# Patient Record
Sex: Female | Born: 1985
Health system: Southern US, Community
[De-identification: ages and names within clinical notes are randomized; demographics above are authoritative.]

## PROBLEM LIST (undated history)

## (undated) DIAGNOSIS — L719 Rosacea, unspecified: Secondary | ICD-10-CM

## (undated) DIAGNOSIS — E079 Disorder of thyroid, unspecified: Secondary | ICD-10-CM

## (undated) DIAGNOSIS — S62609A Fracture of unspecified phalanx of unspecified finger, initial encounter for closed fracture: Secondary | ICD-10-CM

## (undated) DIAGNOSIS — G43009 Migraine without aura, not intractable, without status migrainosus: Secondary | ICD-10-CM

## (undated) DIAGNOSIS — D3610 Benign neoplasm of peripheral nerves and autonomic nervous system, unspecified: Secondary | ICD-10-CM

## (undated) DIAGNOSIS — M810 Age-related osteoporosis without current pathological fracture: Secondary | ICD-10-CM

## (undated) DIAGNOSIS — K589 Irritable bowel syndrome without diarrhea: Secondary | ICD-10-CM

## (undated) HISTORY — DX: Disorder of thyroid, unspecified: E07.9

## (undated) HISTORY — DX: Migraine without aura, not intractable, without status migrainosus: G43.009

## (undated) HISTORY — PX: WISDOM TOOTH EXTRACTION: SHX21

## (undated) HISTORY — PX: FINGER FRACTURE SURGERY: SHX638

## (undated) HISTORY — DX: Age-related osteoporosis without current pathological fracture: M81.0

## (undated) HISTORY — DX: Rosacea, unspecified: L71.9

## (undated) HISTORY — DX: Fracture of unspecified phalanx of unspecified finger, initial encounter for closed fracture: S62.609A

## (undated) HISTORY — PX: COLONOSCOPY: SHX174

## (undated) HISTORY — DX: Benign neoplasm of peripheral nerves and autonomic nervous system, unspecified: D36.10

---
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
## 2007-01-31 ENCOUNTER — Ambulatory Visit: Payer: Self-pay | Admitting: Internal Medicine

## 2007-03-04 ENCOUNTER — Ambulatory Visit: Payer: Self-pay | Admitting: Internal Medicine

## 2008-02-06 ENCOUNTER — Other Ambulatory Visit: Admission: RE | Admit: 2008-02-06 | Discharge: 2008-02-06 | Payer: Self-pay | Admitting: Obstetrics and Gynecology

## 2008-03-20 ENCOUNTER — Ambulatory Visit: Payer: Self-pay | Admitting: Sports Medicine

## 2008-03-20 DIAGNOSIS — M84369A Stress fracture, unspecified tibia and fibula, initial encounter for fracture: Secondary | ICD-10-CM | POA: Insufficient documentation

## 2008-03-20 DIAGNOSIS — M2170 Unequal limb length (acquired), unspecified site: Secondary | ICD-10-CM | POA: Insufficient documentation

## 2008-03-20 DIAGNOSIS — M4120 Other idiopathic scoliosis, site unspecified: Secondary | ICD-10-CM | POA: Insufficient documentation

## 2008-03-20 DIAGNOSIS — M84376A Stress fracture, unspecified foot, initial encounter for fracture: Secondary | ICD-10-CM | POA: Insufficient documentation

## 2008-07-27 ENCOUNTER — Ambulatory Visit: Payer: Self-pay | Admitting: Sports Medicine

## 2008-07-27 DIAGNOSIS — M21619 Bunion of unspecified foot: Secondary | ICD-10-CM | POA: Insufficient documentation

## 2008-10-24 ENCOUNTER — Ambulatory Visit: Payer: Self-pay | Admitting: Sports Medicine

## 2008-10-24 DIAGNOSIS — M629 Disorder of muscle, unspecified: Secondary | ICD-10-CM | POA: Insufficient documentation

## 2008-12-23 ENCOUNTER — Emergency Department (HOSPITAL_COMMUNITY): Admission: EM | Admit: 2008-12-23 | Discharge: 2008-12-23 | Payer: Self-pay | Admitting: Family Medicine

## 2008-12-26 ENCOUNTER — Ambulatory Visit: Payer: Self-pay | Admitting: Sports Medicine

## 2009-01-09 ENCOUNTER — Encounter: Admission: RE | Admit: 2009-01-09 | Discharge: 2009-02-22 | Payer: Self-pay | Admitting: Sports Medicine

## 2009-01-15 ENCOUNTER — Encounter: Payer: Self-pay | Admitting: Sports Medicine

## 2009-01-23 ENCOUNTER — Ambulatory Visit: Payer: Self-pay | Admitting: Sports Medicine

## 2009-02-03 ENCOUNTER — Emergency Department (HOSPITAL_COMMUNITY): Admission: EM | Admit: 2009-02-03 | Discharge: 2009-02-03 | Payer: Self-pay | Admitting: Family Medicine

## 2009-03-11 ENCOUNTER — Encounter: Payer: Self-pay | Admitting: Sports Medicine

## 2009-09-30 ENCOUNTER — Ambulatory Visit: Payer: Self-pay | Admitting: Sports Medicine

## 2009-09-30 DIAGNOSIS — M79609 Pain in unspecified limb: Secondary | ICD-10-CM | POA: Insufficient documentation

## 2009-12-30 ENCOUNTER — Ambulatory Visit: Payer: Self-pay | Admitting: Family Medicine

## 2009-12-30 DIAGNOSIS — IMO0002 Reserved for concepts with insufficient information to code with codable children: Secondary | ICD-10-CM | POA: Insufficient documentation

## 2010-02-07 ENCOUNTER — Ambulatory Visit (HOSPITAL_COMMUNITY): Admission: RE | Admit: 2010-02-07 | Discharge: 2010-02-07 | Payer: Self-pay | Admitting: Gastroenterology

## 2010-02-24 ENCOUNTER — Ambulatory Visit: Payer: Self-pay | Admitting: Family Medicine

## 2010-03-12 ENCOUNTER — Ambulatory Visit (HOSPITAL_COMMUNITY): Admission: RE | Admit: 2010-03-12 | Discharge: 2010-03-12 | Payer: Self-pay | Admitting: Sports Medicine

## 2010-03-12 ENCOUNTER — Ambulatory Visit: Payer: Self-pay | Admitting: Sports Medicine

## 2010-03-13 ENCOUNTER — Encounter (INDEPENDENT_AMBULATORY_CARE_PROVIDER_SITE_OTHER): Payer: Self-pay | Admitting: *Deleted

## 2010-03-17 ENCOUNTER — Ambulatory Visit (HOSPITAL_COMMUNITY): Admission: RE | Admit: 2010-03-17 | Discharge: 2010-03-17 | Payer: Self-pay | Admitting: Sports Medicine

## 2010-03-20 ENCOUNTER — Ambulatory Visit: Payer: Self-pay | Admitting: Sports Medicine

## 2010-08-31 ENCOUNTER — Encounter: Payer: Self-pay | Admitting: Gastroenterology

## 2010-09-09 NOTE — Miscellaneous (Signed)
Summary: MRI APPT  MRI OF R TIBIA [place]  MON AUG 8TH AT 1PM ARRIVE 15 MINS EARLY [phone]

## 2010-09-09 NOTE — Assessment & Plan Note (Signed)
Summary: SHIN SPLINT INJURY,MC   Vital Signs:  Patient profile:   25 year old female Height:      64 inches Weight:      109.25 pounds BMI:     18.82 Pulse rate:   62 / minute BP sitting:   132 / 78  (right arm)  Vitals Entered By: Terese Door (September 30, 2009 8:55 AM) CC: right shin splint injury   Primary Provider:  Enid Baas MD  CC:  right shin splint injury.  History of Present Illness: patient is 25 y/o female here c/o R shin pain. started in January. located in medial aspect of anterior R calf. seemed to start when transitioning to training for track and running on harder surfaces. tried rest for 1 1/2 weeks which helped pain, but it returned once patient went back to running. has tried ice, ibuprofen, without relief. trained on underwater treadmill over the weekend without any difficulty. runs about 40-45 miles per week.   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  Physical Exam  General:  Well-developed,well-nourished,in no acute distress; alert,appropriate and cooperative throughout examination Msk:  +mild TTP of anterior medial R calf in area of soleus. pain exacerbated by R heel raise with toes turned inward.   percussion of tibia is not tender no swelling Extremities:  running gait shows forefoot varus strike no limping now foot placement is straight!    Impression & Recommendations:  Problem # 1:  CALF PAIN, RIGHT (ICD-729.5) Assessment New likely 2/2 strain of soleus muscle. patient to use voltaren gel 3-4 times daily, ice massage daily, and do shin exercises (heel raises, walking lunges with weights, etc). patient also encouraged to run on softer terrain when possible.   can try to train if pain stays < 3 of 10 xtrain but stay off hard indoor surface that  may have triggered this  Problem # 2:  Hx of STRESS FRACTURE, TIBIA (ICD-733.93) not enough evidence to think this is new stress fx if pain increases or swelling recurs though we will reassess and do [place] scan  Complete Medication List: 1)  Voltaren 1 % Gel (Diclofenac sodium) .... Apply 4 g to affected area 3-4 x daily. disp 100 gm tube. Prescriptions: VOLTAREN 1 % GEL (DICLOFENAC SODIUM) apply 4 g to affected area 3-4 x daily. disp 100 gm tube.  #1 x 1   Entered by:   Lequita Asal  MD   Authorized by:   Enid Baas MD   Signed by:   Lequita Asal  MD on 09/30/2009   Method used:   Electronically to        [facility]* (retail)       [address]       [address] [place] [employer]       [place], [place]  [address]       Ph: [phone]       Fax: [phone]   RxID:   [id number]

## 2010-09-09 NOTE — Assessment & Plan Note (Signed)
Summary: F/U SHIN SPLINTS,MC   Vital Signs:  Patient profile:   25 year old female BP sitting:   97 / 64  Vitals Entered By: Lillia Pauls CMA (March 12, 2010 9:57 AM)  Primary Provider:  Enid Baas MD   History of Present Illness: R tibial pain since January. Started along medial border but now anterior border Continuous cycle of trying to build up mileage and breaking down since then. Saw dr Jennette Kettle 2 weeks ago, given dorsiflexion exercises and straight line drills. Pain persisting.  Running 30 mins 6 days week, cut back from 40-45, no impovement. Worse this week, mainly after running. Biked friday saturday, bad pain, ran monday and tuesday pain seems a bit less. L tibial stress # 2 yrs ago.   Allergies: No Known Drug Allergies  Physical Exam  General:  Well-developed,well-nourished,in no acute distress; alert,appropriate and cooperative throughout examination Msk:  RT Anterior tibia TTP  ~6 inches proximal to tibio-talar joint.  Some tenderness along medial border. Anterior tibial pain on resisted dorsiflexion.  there is some localized swelling over the ant tibia as well  LT this is non tender  orthotics reviewed and base on RT is worn down Additional Exam:  MSK [place] Anterior tibial cortex shows localized swelling and hypoechoic area on transverse scan there is evidence of cortical thickening and some calcification no increase in doppler flow suspicious but not diagnostic for  stress fx  images saved   Impression & Recommendations:  Problem # 1:  SHIN SPLINTS (ICD-844.9)  Orders: Radiology other (Radiology Other)   we need to move ahead with Xray If this is negative consider MRI she does have an air splint and will need to use that if stress fx If an anterior stress fx healing will be slow  stop running xtrain on bike or pool  try to work this out before return to [place] [address] in 2 wks  Complete Medication List: 1)  Voltaren 1 % Gel (Diclofenac sodium) ....  Apply 4 g to affected area 3-4 x daily. disp 100 gm tube.  Appended Document: F/U SHIN SPLINTS,MC Note we did a second base on her orthotic on RT to help further correct leg length and give more cushion

## 2010-09-09 NOTE — Assessment & Plan Note (Signed)
Summary: F/U,MC   Vital Signs:  Patient profile:   25 year old female BP sitting:   103 / 67  Vitals Entered By: Lillia Pauls CMA (March 20, 2010 2:40 PM)  Primary Provider:  Enid Baas MD   History of Present Illness:  Catherine Clay returns today for follow of MRI This showed edema in distal to mid third of tibia medially and posteriorly RT > LT but bilat no sign of stress fx still ahs been painful has switched to biking Max mileage this summer was about 45  comes for replacement of orthotics as she is getting back into shin problems and others are 23 years old  Allergies: No Known Drug Allergies  Physical Exam  General:  Well-developed,well-nourished,in no acute distress; alert,appropriate and cooperative throughout examination Msk:  RT leg is ~ 1.5 cms short no swelling good alginment and strngth  gait shows strike in forefoot varus with leg length correction RT foot motion is more normal but swings on back kick to outside without this   Impression & Recommendations:  Problem # 1:  SHIN SPLINTS (ICD-844.9)  has had left tibial stress fx and RT tibial stress rxn in past  now with bone edema bilat on MRI  will put into new orthotics change training to 30 mpw max good Vit D, C and Cdlcium xtrain and work strength  Note she has had BMD and was low but not abnormal  Patient was fitted for a standard, cushioned, semi-rigid orthotic.  The orthotic was heated and the patient stood on the orthotic blank positioned on the orthotic stand. The patient was positioned in subtalar neutral position and 10 degrees of ankle dorsiflexion in a weight bearing stance. After completion of molding a stable based was applied to the orthotic blank.   The blank was ground to a stable position for weight bearing. size 7 ble swirl base blue EVA posting forefoot lat posting bila additional orthotic padding  extra foam layer to RT leg for leg length  35 mins  Orders: Orthotic Materials,  each unit (L3002) Garment,belt,sleeve or other covering ,elastic or similar stretch (E4540) Garment,belt,sleeve or other covering ,elastic or similar stretch (J8119)  Problem # 2:  CALF PAIN, RIGHT (ICD-729.5)  will add body helix calf sleeves as the post tib and calf mm keep getting tight  maybe this will lessen amt of mm soreness  will reck on return from  [facility]  Orders: Garment,belt,sleeve or other covering ,elastic or similar stretch (J4782) Garment,belt,sleeve or other covering ,elastic or similar stretch (N5621)  Complete Medication List: 1)  Voltaren 1 % Gel (Diclofenac sodium) .... Apply 4 g to affected area 3-4 x daily. disp 100 gm tube.  Appended Document: F/U,MC

## 2010-09-09 NOTE — Assessment & Plan Note (Signed)
Summary: F/U,MC   Vital Signs:  Patient profile:   25 year old female BP sitting:   112 / 74  Primary Care Provider:  Enid Baas MD   History of Present Illness: Continued right shin pain. Running 35-45 minutes a day, six days a week. pain is medial right shin orthotics she had made 2 y ago--still seem to help when running on hard surface but on grass or trails seems like they make things worse  no new injury  Current Medications (verified): 1)  Voltaren 1 % Gel (Diclofenac Sodium) .... Apply 4 G To Affected Area 3-4 X Daily. Disp 100 Gm Tube.  Allergies: No Known Drug Allergies  Physical Exam  General:  alert, well-developed, well-nourished, and well-hydrated.   Msk:  medial to shin on right TTP.   GAIT: normal varus forefot strike  distally NV intact   Impression & Recommendations:  Problem # 1:  SHIN SPLINTS (ICD-844.9)  Orders: Aircast Leg brace (E4540) Gabriel discussion she has questions  about using an aircast we will try it while she runs also consider making her new orthotics she ill let me know  Complete Medication List: 1)  Voltaren 1 % Gel (Diclofenac sodium) .... Apply 4 g to affected area 3-4 x daily. disp 100 gm tube.

## 2010-09-09 NOTE — Assessment & Plan Note (Signed)
Summary: SHIN SPLINTS,MC   Vital Signs:  Patient profile:   25 year old female BP sitting:   110 / 82  Vitals Entered By: Lillia Pauls CMA (Dec 30, 2009 2:22 PM)  Primary Care Provider:  Enid Baas MD   History of Present Illness: DATE of INJURY:  12/28/2009 Right foot pain between 1st and 2nd toes  that started after hiking (in tennis shoes, with orthotics) on Saturday. Pain is 2-4/10, feels sort of numb. Similar to  pain she had with a previous injury on area inlateral foot that was dx as a stress reaction. Worried that this si going  to be a [place] term problem.  2) Right lower leg pain, not exactly like a shin splint but similar. Feels like the muscle needs to stretch. Worse day after running. [place] during run. Pain is mild to moderate--she desires stretching exercises.  Currently running 10 min every other day and biking or swimming to xtrain. Runs cross country  PERTINENT PMH/PSH: Shin splints in past, esp troublesome with switching from xc to trackor hader surfaces.  Allergies: No Known Drug Allergies  Physical Exam  General:  alert, well-developed, well-nourished, and well-hydrated.   No pain with heel raise with foot inverted.  Area between 1 2 MT heads is area on foot where pain is centered. Not tender to palpation. Slight anasthesia to soft touch in asmall focal area about dime size but sharp sensation iintact  [place]. i imaged biothe her shin and her MT heads and saw no edema, no suspicious areas for stress fracture on shin. Msk:  Right shin is nontender to palpation.  the soleus muscle (as accessed from area next to shin) is somewhat tender to palpation. The calf is sioft.   FEET R>L foot has loss of transverse arch with a lot of pressure on 2nd MT head. No callous formation.   Impression & Recommendations:  Problem # 1:  SHIN SPLINTS (ICD-844.9) mild early shin splint. I [place] the area mostly to reassure her--this is not a stress fracture. Recommended HEP and gave her  handout. ICE.  Problem # 2:  FOOT PAIN, RIGHT (ICD-729.5) small area of anasthesia c/w mild nerve injury of 1st interdigital nerve. She has not had this problem when running so I hesitate to adjust her orthotics. She went hiking in poorly supportive shoes and she has loss of transverse arch. She does not k=hike often---if this becomes ongoing issue would either add MT pad to her current orthotic or more likely make 2nd pair for walking and add MT pad to those. For now, would not hike, can continue to run unless area worsens, rtc as needed. iexpect this small area of anasthesia to resolve over nect 2 weeks.  Complete Medication List: 1)  Voltaren 1 % Gel (Diclofenac sodium) .... Apply 4 g to affected area 3-4 x daily. disp 100 gm tube.

## 2010-10-31 IMAGING — CR DG TIBIA/FIBULA 2V*R*
4 series · 4 of 4 positions shown · non-contrast
Comparison: None

CLINICAL DATA: Leg pain.

RIGHT TIBIA AND FIBULA - 2 VIEW

[t tib/fib ap right (1 of 2)]
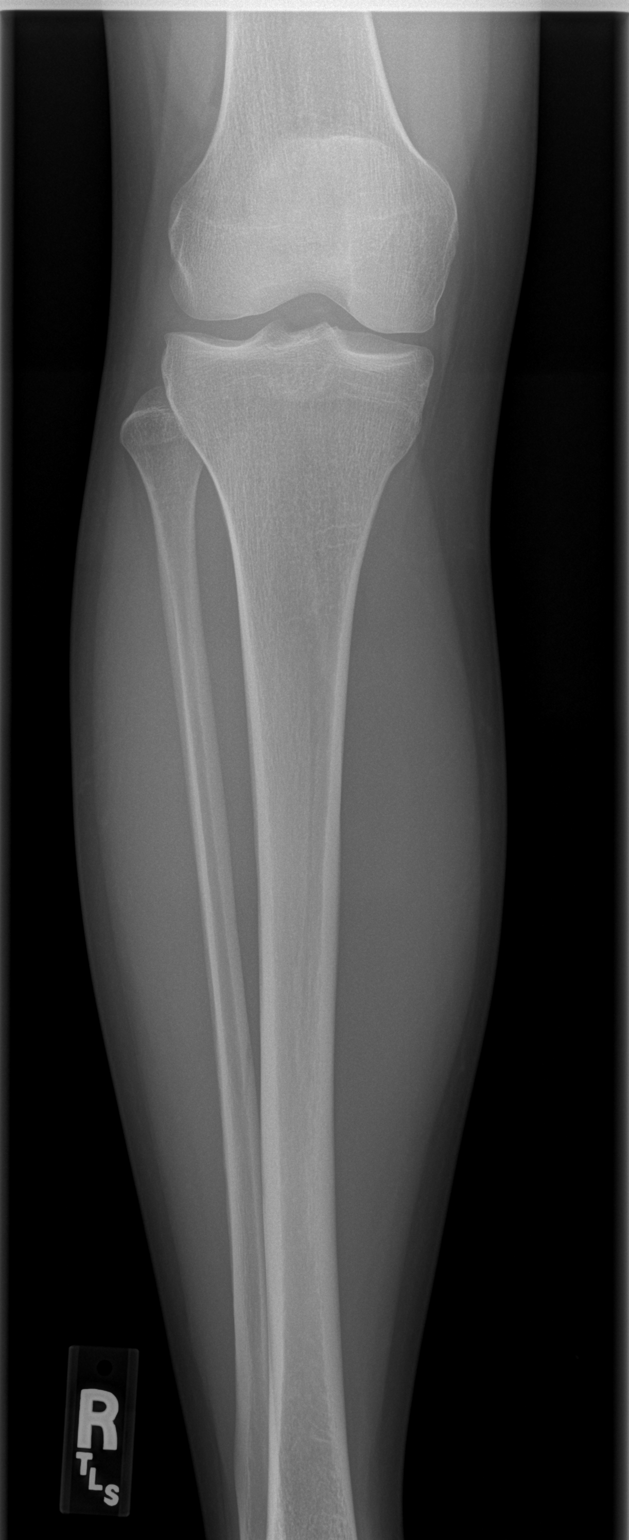

[t tib/fib ap right (2 of 2)]
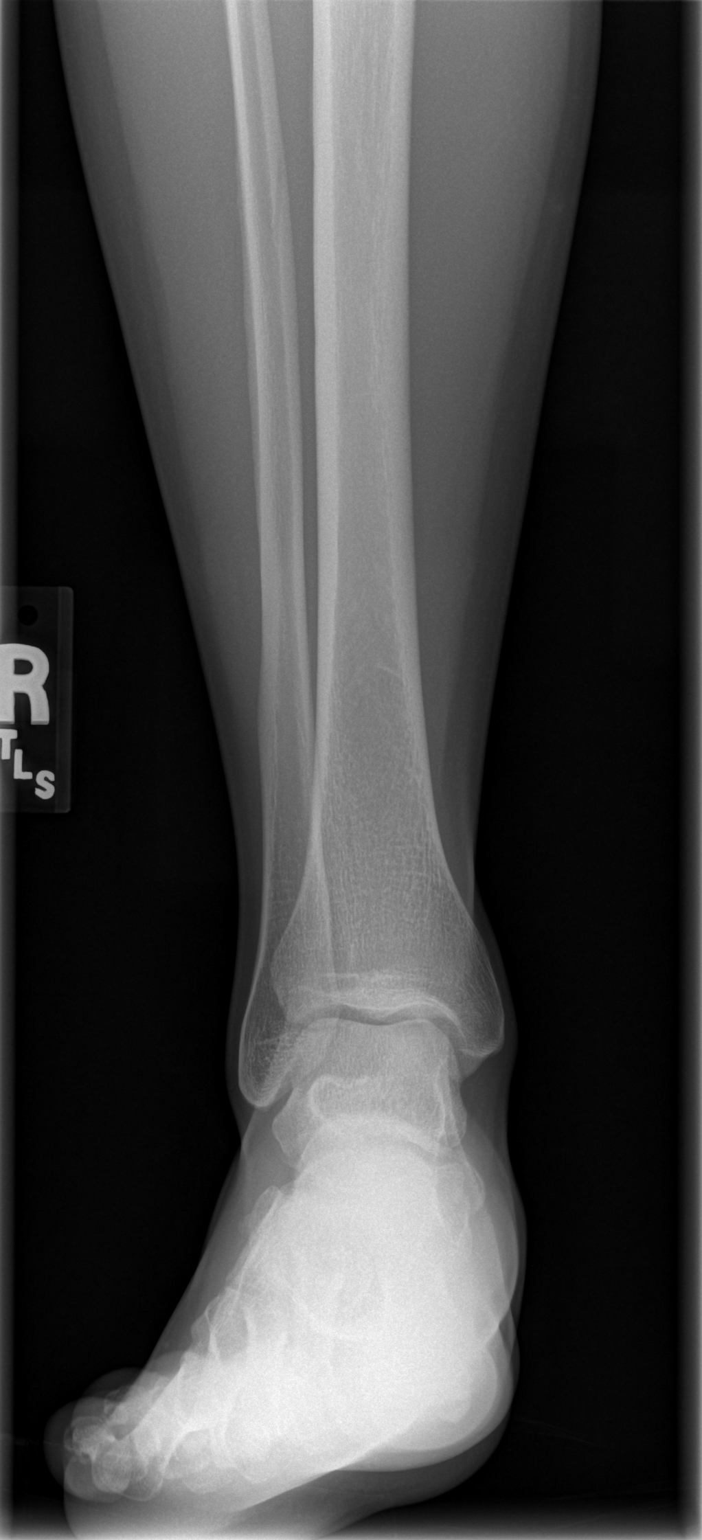

[t tib/fib lat right (1 of 2)]
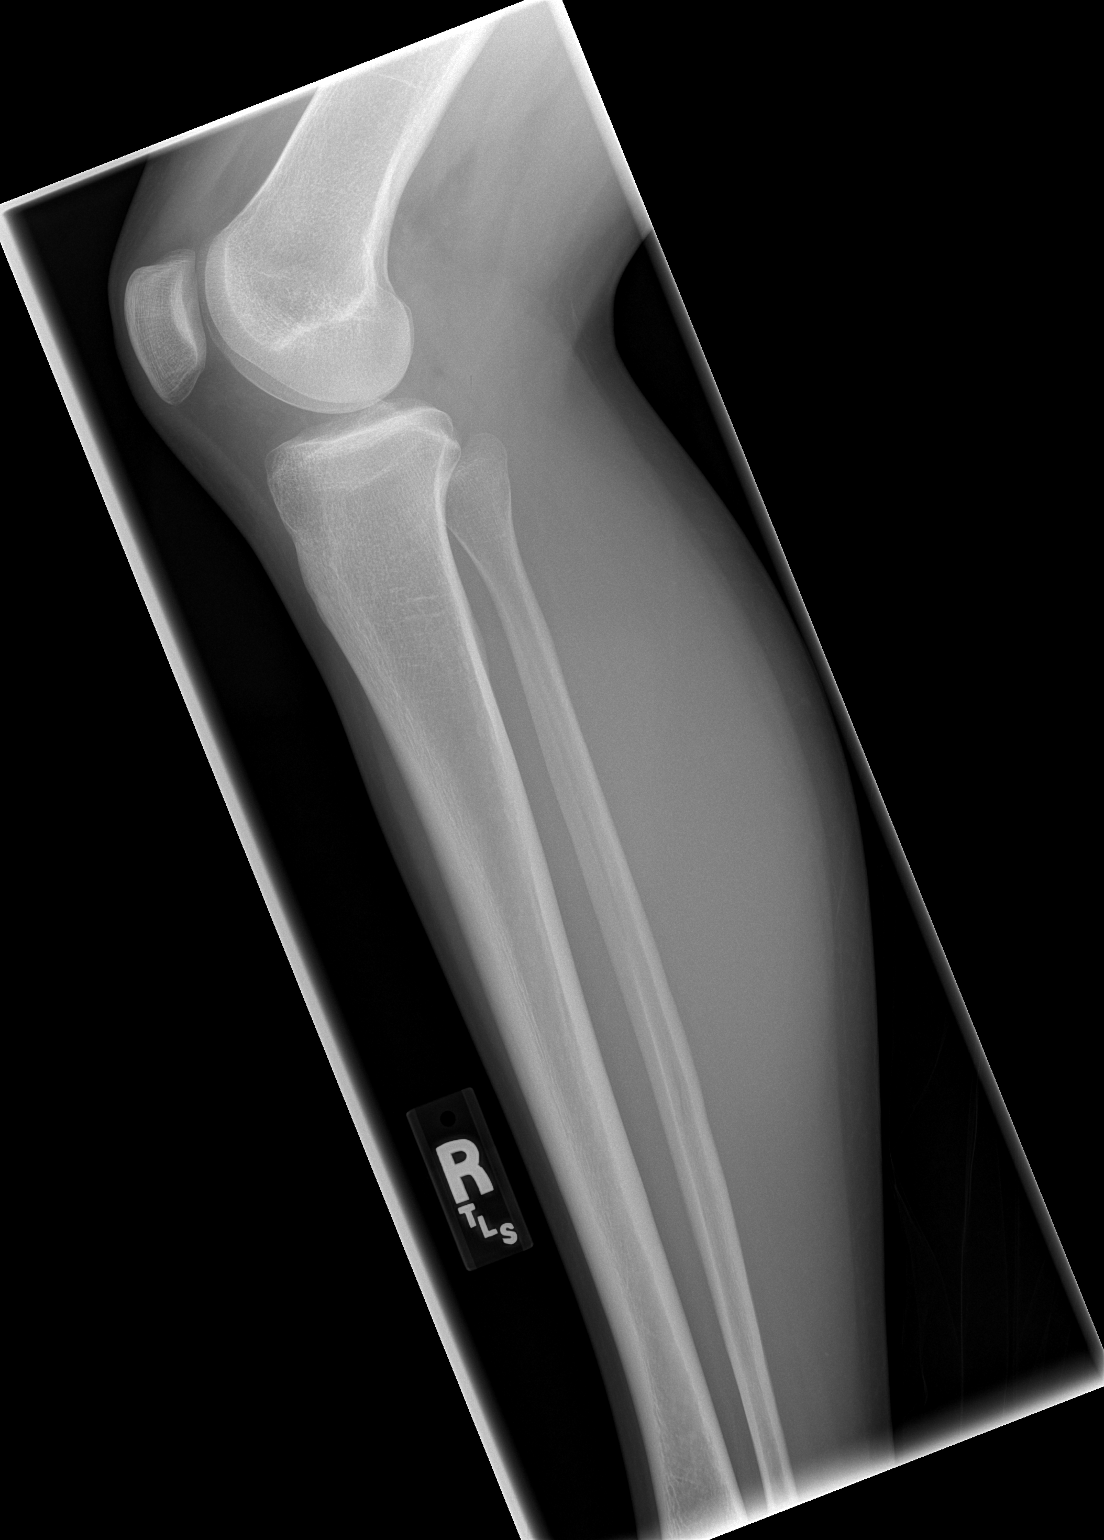

[t tib/fib lat right (2 of 2)]
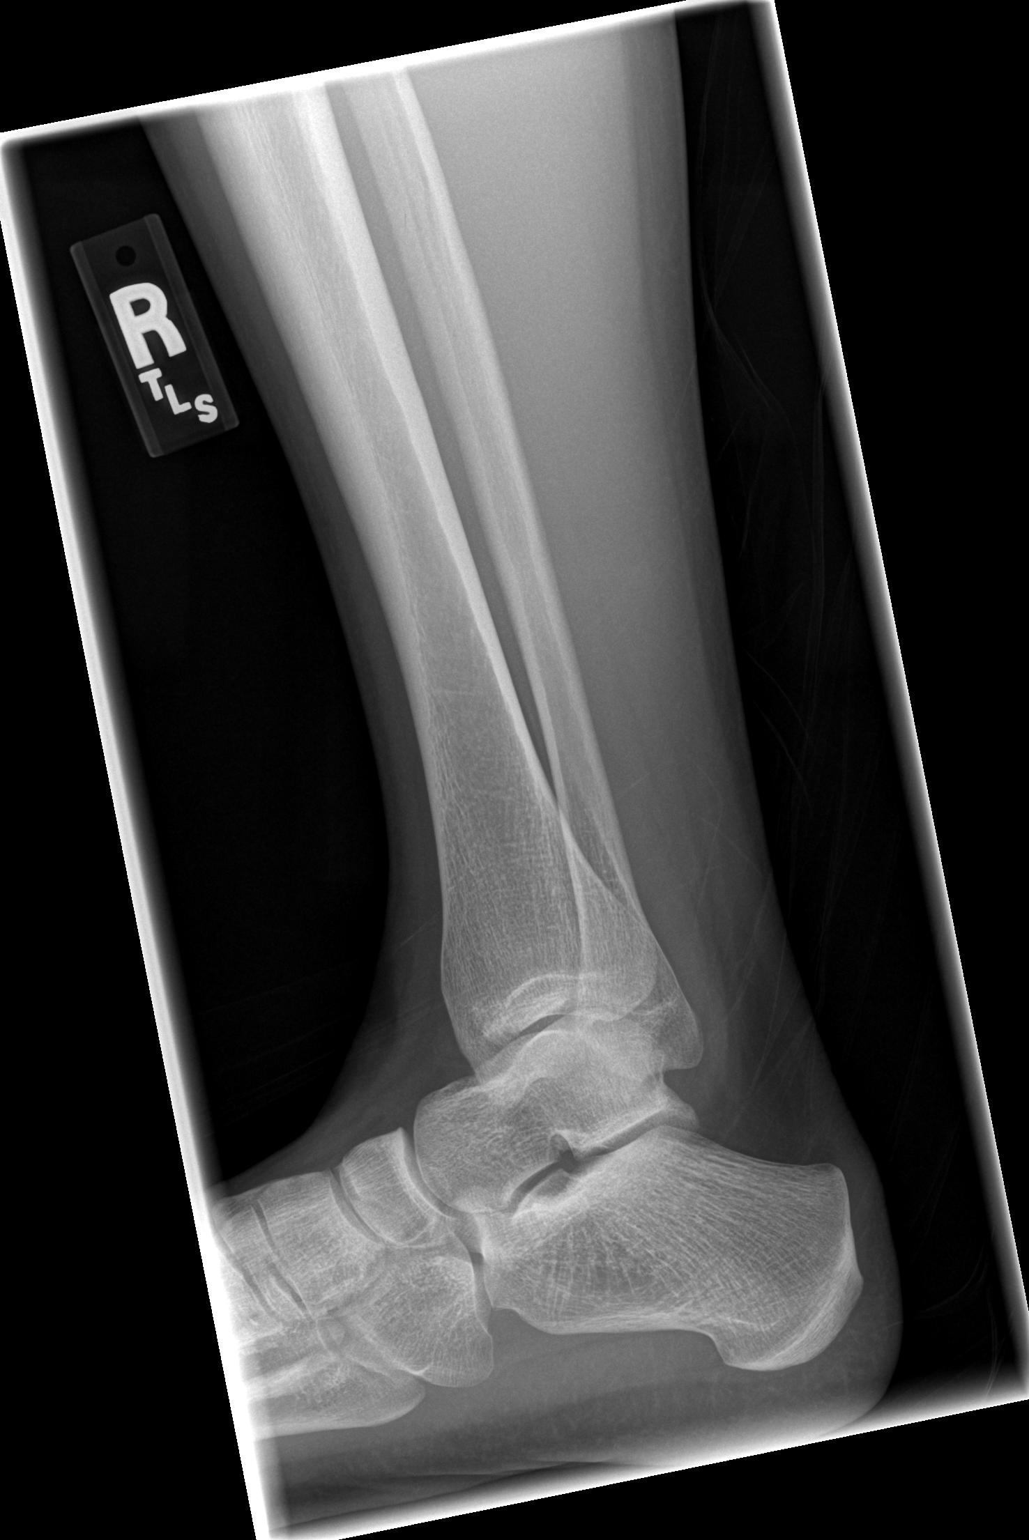

[4 of 4 positions shown; findings below may reference images not displayed]

FINDINGS: The medial ankle joints are maintained.  The tibia and
fibula are normal.
IMPRESSION: No acute bony findings.

## 2010-12-23 NOTE — Assessment & Plan Note (Signed)
[facility]                         GASTROENTEROLOGY OFFICE NOTE   NAME:SHULL, LURLENE                           MRN:          [id number]  DATE:03/04/2007                            DOB:          March 20, 1986    Lowell returns today for followup of anal fissure, which was diagnosed on  anoscopy on January 31, 2007.  This is a recurrent fissure.  She was put on  Anopram cream and Anusol-HC suppositories with complete resolution of  her symptoms until last week when she ran out of the suppositories, and  the symptoms of rectal pain recurred.  She denies any rectal bleeding,  but the pain is back but not as bad as before.   I have discussed anal fissure with Alaynah and her mother.  I have  suggested that she may need surgical repair of the anal fissure because  of several recurrences but also gave her the option of continuing Anusol-  HC suppositories and Anaprim cream indefinitely, as it keeps it under  control.  She would prefer at this time to treat it conservatively to  avoid surgery.  I asked Megumi to check with me in about three months.  The surgery ought to be done when school is out, either in the summer or  in the wintertime.  She will either call me for referral or come back to  be re-examined.     Hedwig Morton. Juanda Chance, MD  Electronically Signed    DMB/MedQ  DD: 03/04/2007  DT: 03/04/2007  Job #: [id number]   cc:   Duncan Dull, M.D.

## 2010-12-23 NOTE — Assessment & Plan Note (Signed)
[facility]                         GASTROENTEROLOGY OFFICE NOTE   NAME:Catherine Clay, Catherine Clay                           MRN:          [id number]  DATE:01/31/2007                            DOB:          1986/01/19    Catherine Clay is an 25 year old white female with history of anal fissure first  evaluated in August 2007.  She was put on Analpram cream and Anusol HC  suppositories.  The rectal pain never really went away and she has  chronic rectal discomfort when having bowel movements.  She currently  denies any rectal bleeding.  She denies constipation.  She has used some  over-the-counter preparations.   MEDICATIONS:  Tretinoin, [place]stone Vitamins, and hemorrhoidal  suppositories q.h.s.   PHYSICAL EXAMINATION:  VITAL SIGNS:  Blood pressure 98/60, pulse 56, and  weight 174 pounds.  ABDOMEN:  Unremarkable.  LUNGS:  Clear to auscultation.  CORONARY:  Normal S1, normal S2.  RECTAL:  Anoscopic exam reveals anal fissure at 3 o'clock.  It is about  1 cm [place].  It is not deep but it shows redness and some stigmata of  recent bleeding.  There were no internal hemorrhoids.   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  This is at least second or third recurrence.   PLAN:  I have discussed possible anal fissurectomy which may have to be  done to relieve the chronic fissure.  The alternative would be to try  one more time  an intense regimen, with three-times-a-day Analpram cream  and Anusol HC suppositories for the next 4-5 weeks.  I would like to  reexamine her at the end of this period of time and decide if she needs  a surgical fissurectomy.  She will return in about 5 weeks.  We will  reexamine her.  I gave her a booklet on anal fissure and asked to pick  up a prescription which was sent by e-prescribing to [facility] in  [place].     Hedwig Morton. Juanda Chance, MD  Electronically Signed    DMB/MedQ  DD: 01/31/2007  DT: 02/01/2007  Job #: [id number]   cc:   Duncan Dull, M.D.

## 2010-12-26 NOTE — Assessment & Plan Note (Signed)
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  We saw her for rectal pain and bleeding on December 04, 2005 and  treated her with Proctofoam, Anusol HC suppositories, Analpram cream with  almost complete resolution of the symptoms.  She feels that it never got  completely well, but about 4 weeks ago, she started having more pain and  bleeding and started applying over the counter hemorrhoidal medication,  which has improved her symptoms about 50%.  She denies being constipated or  straining.  She runs track in high school and is very active, always takes a  shower afterward and denies straining or holding her bowel movements.  She  denies abdominal pain.  Bleeding had stopped about 2 weeks ago.   PHYSICAL EXAMINATION:  VITAL SIGNS:  Blood pressure 112/60, pulse 60 and  weight 99 pounds.  GENERAL:  She was thin, alert, oriented, no distress.  LUNGS:  Clear to auscultation.  COR:  Normal S1, S2.  ABDOMEN:  Soft, scaphoid, with normoactive bowel sounds.  No tenderness.  ANOSCOPIC AND RECTAL EXAM:  Shows a normal perianal area, rectal tone which  was normal.  Tender area at 3:00.  No definite fissure, but rectal vaults  were rather generous at 3:00, and I did not see any blood or any fissure,  and there was no hemorrhoid, but there was clearly an area of tenderness,  and I suspect there was a healing fissure.  Stool was heme-occult negative.   IMPRESSION:  Recurrent anal fissure, now healing.   PLAN:  Repeat conservative treatment using Analpram 2.5% cream b.i.d. and  Anusol HC suppositories q.h.s.  I would like to recheck her 2 months.  Mucosal lesion is rather small.  I do not see any point in surgical  referral.   If she keeps recurring, I would probably do a flexible  sigmoidoscopy to have a good view of this area, especially being able to  retroflex in the retal area to see the lesion itself.                                   Hedwig Morton. Juanda Chance, MD   DMB/MedQ  DD:  04/02/2006  DT:  04/02/2006  Job #:  [id number]   cc:   Duncan Dull, MD

## 2011-02-26 ENCOUNTER — Ambulatory Visit (INDEPENDENT_AMBULATORY_CARE_PROVIDER_SITE_OTHER): Payer: 59 | Admitting: Sports Medicine

## 2011-02-26 ENCOUNTER — Encounter: Payer: Self-pay | Admitting: Sports Medicine

## 2011-02-26 ENCOUNTER — Other Ambulatory Visit: Payer: 59

## 2011-02-26 VITALS — BP 107/73 | HR 56 | Ht 64.0 in | Wt 108.0 lb

## 2011-02-26 DIAGNOSIS — R5383 Other fatigue: Secondary | ICD-10-CM

## 2011-02-26 DIAGNOSIS — R5381 Other malaise: Secondary | ICD-10-CM

## 2011-02-26 LAB — CBC
HCT: 38.9 % (ref 36.0–46.0)
Hemoglobin: 13.6 g/dL (ref 12.0–15.0)
MCH: 32.1 pg (ref 26.0–34.0)
MCHC: 35 g/dL (ref 30.0–36.0)
RBC: 4.24 MIL/uL (ref 3.87–5.11)
WBC: 5.2 10*3/uL (ref 4.0–10.5)

## 2011-02-26 LAB — TSH: TSH: 3.289 u[IU]/mL (ref 0.350–4.500)

## 2011-02-26 NOTE — Progress Notes (Signed)
Drew ferritin, TSH, CBC for [facility] Med clinic [facility], MLS

## 2011-02-26 NOTE — Assessment & Plan Note (Addendum)
Not sure etiology. I think the murmur is not likely to be pathologic as it goes away with side position and standing.  Suspect exercise induced asthma.  Will test with provocative testing tomorrow morning following a hard run.   Plan: Pre peak flow then 20 mins hard run then post peak flow at 1, 3, 6, 9 ,12, 15 mins  On 7/19 930 am. If reducing will diagnose as exercise-induced asthma and treat with albuterol prior to exercise.  We'll do some labs today consisting of a CBC TSH and ferritin.  Patient expresses understanding.  If no abnormalities found will consider nutrition consult.

## 2011-02-26 NOTE — Progress Notes (Signed)
Catherine Clay has noted for the past 4 years fatigue while exercising.  She attended [employer] where she was a member of the cross country and track teams.  During college and high school she was a very competitive runner. However over the past 4 years she noted that soon after the season started to become very fatigued following runs.  She notes that during runs she would had chest tightness and become tired. She denies any chest pain or palpitations or wheeze with the symptoms. She also notes that her times during training were lower than they had been in the past.  For example in 400 m training she is only able to run in 78 seconds while prior she had been in the 60s.  Her symptoms have persisted even after graduating college and no longer participating in highly competitive college races.    Her diet consists of a grapefruit with cereal and a multivitamin prior to runs, and greek yogurt and a peanut butter sandwich following runs.  She is on combined oral contraceptives and cycles monthly with 4 days not excessive.  Additionally in college she's had hemoglobin and ferritin measured and found to be normal.   Following races she would note some cough in her symptoms may be worse with cold weather.  She denies any allergy symptoms like runny nose or itchy eyes. Her brother has been diagnosed with exercise-induced asthma.  No history of any significant medical illnesses.  Exam: Gen. Whell appearing woman in no acute distress. Lungs clear to auscultation bilaterally with normal work of breathing Heart:  heart rate in the 50s to 60s regular when laying a soft systolic murmur was noted in the left start lower sternal border area it did not radiate. However this murmur disappears upon standing and left lying position.  Pulses: 2+ and equal pulses in bilateral wrists and feet.

## 2011-02-27 ENCOUNTER — Ambulatory Visit (INDEPENDENT_AMBULATORY_CARE_PROVIDER_SITE_OTHER): Payer: 59 | Admitting: Sports Medicine

## 2011-02-27 DIAGNOSIS — R5383 Other fatigue: Secondary | ICD-10-CM

## 2011-02-27 DIAGNOSIS — R5381 Other malaise: Secondary | ICD-10-CM

## 2011-02-27 NOTE — Progress Notes (Signed)
Jamariya presents to clinic today to followup her fatigue. She is 63 inches tall and 25 years old.  Her expected peak flow is 402. Today she ran 21 minutes and a piece of around 8 minutes and 20 seconds per mile. On the run she did feel fatigued she had in the past.  Pre-exercise peak flow:                       Best 450 One minute post exercise peak flow:  Best 430 3 minutes post exercise peak flow:   Best 440 6 minutes post exercise peak flow:   Best 440 9 minutes post exercise peak flow:   Best 440 12 minutes post exercise peak flow:   Best 440 15 minutes post exercise peak flow:   Best 430  Exam: Gen. well-appearing Lungs: Clear to auscultation bilaterally normal work of breathing no wheeze. Heart: Regular rate and rhythm 2/6 systolic murmur best heard in the left sternal borders non-radiating. Murmur diminishes upon standing.

## 2011-02-27 NOTE — Assessment & Plan Note (Signed)
Post exercise peak flows were nondiagnostic for exercise-induced asthma. Additionally her laboratory findings from yesterday were normal (see below).  Her murmur continues to sound nonpathologic.  At this point the most likely diagnosis is inadequate nutrition. Plan for referral to nutritionist. Patient will complete a three-day food diary and training log prior to presentation to nutritionist. Refer to Dr. Gerilyn Pilgrim.  Plan discussed with patient who voices understanding.   Lab Results  Component Value Date   WBC 5.2 02/26/2011   HGB 13.6 02/26/2011   HCT 38.9 02/26/2011   MCV 91.7 02/26/2011   PLT 267 02/26/2011   Lab Results  Component Value Date   TSH 3.289 02/26/2011    Lab Results  Component Value Date   FERRITIN 35 02/26/2011

## 2011-03-17 ENCOUNTER — Encounter: Payer: Self-pay | Admitting: Family Medicine

## 2011-03-17 ENCOUNTER — Ambulatory Visit (INDEPENDENT_AMBULATORY_CARE_PROVIDER_SITE_OTHER): Payer: 59 | Admitting: Family Medicine

## 2011-03-17 DIAGNOSIS — R5381 Other malaise: Secondary | ICD-10-CM

## 2011-03-17 DIAGNOSIS — R5383 Other fatigue: Secondary | ICD-10-CM

## 2011-03-17 NOTE — Patient Instructions (Addendum)
-   Increase vegetable intake as well as fruit.  Some of the best fruits are berries.  Also high in nutrition are peaches, citrus fruits, and melon.   - Ideally, each meal and snack will include some protein.  - Goal:  Vegetables at least once a day.   - Taste preferences are learned.   - Make a list of vegetables you like, and ways to prepare them that are relatively easy/quick.  Keep frozen veg's on hand.   - Post-school and pre-run snacks:  Yogurt, fruit, carrots, pb sandwich, protein bar, drink, chips.   - Dinner should be immediately after running, but you'd also be well served to have a quick supplement pre-dinner, i,e., protein drink.  Suggested drinks include Progenex, Pro-Stat 101, or chocolate milk.  This needs to be fat-free (or very low-fat).  Aim for ratio of 2-4 carb: protein.   Catherine Clay's Sports Nutrition Guidebook.   - PLANNING will be key to better nutrition.  - Call/email if have Qs:  [phone] / [email or web address].  And let me know how it's going in 6-8 wks.

## 2011-03-17 NOTE — Progress Notes (Signed)
Medical Nutrition Therapy:  Appt start time: 1330 end time:  1430.  Assessment:  Primary concerns today: fatigue.  An graduated from [employer] where she ran XC.  She has been feeling increasingly fatigued with running in the past 4 years, and her run times have slowed.  Usual eating pattern is snacking primarily, including breakfast and dinner.  Everyday foods include fruit (grapefruit), cereal (Multigrain Cheerios, [place]), [place] yogurt, 16 oz V-8 Fusion, cheese, peanut butter/nuts.  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  When school starts, runs will like be after 7 PM, 6 X wk.  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  Usual physical activity includes running 35-40 mpw, 30 min strength training (4 X wk).  Food is clearly not a high priority for Catherine Clay, but planning and some forethought will be critical for optimizing her nutrition.    Progress Towards Goal(s):  In progress.   Nutritional Diagnosis:  NI-1.4 Inadequate energy intake As related to expenditure.  As evidenced by fatigue during and post-exercise.    Intervention:  Nutrition education.  Monitoring/Evaluation:  Dietary intake, exercise, and body weight prn.

## 2011-08-26 ENCOUNTER — Encounter (HOSPITAL_BASED_OUTPATIENT_CLINIC_OR_DEPARTMENT_OTHER): Payer: Self-pay | Admitting: *Deleted

## 2011-08-27 ENCOUNTER — Ambulatory Visit (HOSPITAL_BASED_OUTPATIENT_CLINIC_OR_DEPARTMENT_OTHER): Payer: BC Managed Care – PPO | Admitting: Anesthesiology

## 2011-08-27 ENCOUNTER — Encounter (HOSPITAL_BASED_OUTPATIENT_CLINIC_OR_DEPARTMENT_OTHER): Payer: Self-pay | Admitting: Anesthesiology

## 2011-08-27 ENCOUNTER — Encounter (HOSPITAL_BASED_OUTPATIENT_CLINIC_OR_DEPARTMENT_OTHER): Payer: Self-pay | Admitting: *Deleted

## 2011-08-27 ENCOUNTER — Ambulatory Visit (HOSPITAL_BASED_OUTPATIENT_CLINIC_OR_DEPARTMENT_OTHER)
Admission: RE | Admit: 2011-08-27 | Discharge: 2011-08-27 | Disposition: A | Discharge status: Home / Self Care | Payer: BC Managed Care – PPO | Source: Ambulatory Visit | Attending: Orthopedic Surgery | Admitting: Orthopedic Surgery

## 2011-08-27 ENCOUNTER — Encounter (HOSPITAL_BASED_OUTPATIENT_CLINIC_OR_DEPARTMENT_OTHER): Payer: Self-pay | Admitting: Certified Registered"

## 2011-08-27 ENCOUNTER — Encounter (HOSPITAL_BASED_OUTPATIENT_CLINIC_OR_DEPARTMENT_OTHER)
Admission: RE | Disposition: A | Discharge status: Home / Self Care | Payer: Self-pay | Source: Ambulatory Visit | Attending: Orthopedic Surgery

## 2011-08-27 DIAGNOSIS — Y929 Unspecified place or not applicable: Secondary | ICD-10-CM | POA: Insufficient documentation

## 2011-08-27 DIAGNOSIS — W098XXA Fall on or from other playground equipment, initial encounter: Secondary | ICD-10-CM | POA: Insufficient documentation

## 2011-08-27 DIAGNOSIS — Y9355 Activity, bike riding: Secondary | ICD-10-CM | POA: Insufficient documentation

## 2011-08-27 DIAGNOSIS — IMO0002 Reserved for concepts with insufficient information to code with codable children: Secondary | ICD-10-CM | POA: Insufficient documentation

## 2011-08-27 SURGERY — OPEN REDUCTION INTERNAL FIXATION (ORIF) FINGER WITH RADIAL BONE GRAFT
Anesthesia: General | Site: Finger | Laterality: Left | Wound class: Clean

## 2011-08-27 MED ORDER — DEXAMETHASONE SODIUM PHOSPHATE 10 MG/ML IJ SOLN
INTRAMUSCULAR | Status: DC | PRN
  Administered 2011-08-27: 10 mg via INTRAVENOUS

## 2011-08-27 MED ORDER — PROPOFOL 10 MG/ML IV EMUL
INTRAVENOUS | Status: DC | PRN
  Administered 2011-08-27: 130 mg via INTRAVENOUS

## 2011-08-27 MED ORDER — ONDANSETRON HCL 4 MG/2ML IJ SOLN
INTRAMUSCULAR | Status: DC | PRN
  Administered 2011-08-27: 4 mg via INTRAVENOUS

## 2011-08-27 MED ORDER — FENTANYL CITRATE 0.05 MG/ML IJ SOLN
INTRAMUSCULAR | Status: DC | PRN
  Administered 2011-08-27: 100 ug via INTRAVENOUS
  Administered 2011-08-27: 50 ug via INTRAVENOUS
  Administered 2011-08-27: 25 ug via INTRAVENOUS
  Administered 2011-08-27: 50 ug via INTRAVENOUS
  Administered 2011-08-27: 25 ug via INTRAVENOUS

## 2011-08-27 MED ORDER — DOXYCYCLINE HYCLATE 100 MG PO TABS: 100.0000 mg | ORAL_TABLET | Freq: Two times a day (BID) | ORAL | Status: AC

## 2011-08-27 MED ORDER — LIDOCAINE HCL 2 % IJ SOLN
INTRAMUSCULAR | Status: DC | PRN
  Administered 2011-08-27: 4 mL

## 2011-08-27 MED ORDER — OXYCODONE-ACETAMINOPHEN 5-325 MG PO TABS: ORAL_TABLET | ORAL | Status: DC

## 2011-08-27 MED ORDER — VANCOMYCIN HCL 1000 MG IV SOLR
750.0000 mg | INTRAVENOUS | Status: AC
  Administered 2011-08-27: 750 mg via INTRAVENOUS

## 2011-08-27 MED ORDER — MIDAZOLAM HCL 5 MG/5ML IJ SOLN
INTRAMUSCULAR | Status: DC | PRN
  Administered 2011-08-27: 2 mg via INTRAVENOUS

## 2011-08-27 MED ORDER — PROMETHAZINE HCL 25 MG/ML IJ SOLN: 6.2500 mg | INTRAMUSCULAR | Status: DC | PRN

## 2011-08-27 MED ORDER — LIDOCAINE HCL (CARDIAC) 20 MG/ML IV SOLN
INTRAVENOUS | Status: DC | PRN
  Administered 2011-08-27: 40 mg via INTRAVENOUS

## 2011-08-27 MED ORDER — LACTATED RINGERS IV SOLN
INTRAVENOUS | Status: DC
  Administered 2011-08-27 (×2): via INTRAVENOUS

## 2011-08-27 MED ORDER — MEPERIDINE HCL 25 MG/ML IJ SOLN: 6.2500 mg | INTRAMUSCULAR | Status: DC | PRN

## 2011-08-27 MED ORDER — FENTANYL CITRATE 0.05 MG/ML IJ SOLN: 25.0000 ug | INTRAMUSCULAR | Status: DC | PRN

## 2011-08-27 SURGICAL SUPPLY — 74 items
BANDAGE ADHESIVE 1X3 (GAUZE/BANDAGES/DRESSINGS) IMPLANT
BANDAGE ELASTIC 3 VELCRO ST LF (GAUZE/BANDAGES/DRESSINGS) ×1 IMPLANT
BANDAGE ELASTIC 4 VELCRO ST LF (GAUZE/BANDAGES/DRESSINGS) IMPLANT
BANDAGE GAUZE ELAST BULKY 4 IN (GAUZE/BANDAGES/DRESSINGS) IMPLANT
BIT DRILL 1.0 (BIT) ×2
BIT DRILL 1.0X50 (BIT) IMPLANT
BIT DRILL 1.3 (BIT) ×1
BIT DRILL 1.3MM (BIT) IMPLANT
BLADE MINI RND TIP GREEN BEAV (BLADE) IMPLANT
BLADE SURG 15 STRL LF DISP TIS (BLADE) ×1 IMPLANT
BLADE SURG 15 STRL SS (BLADE) ×2
BNDG CMPR 9X4 STRL LF SNTH (GAUZE/BANDAGES/DRESSINGS)
BNDG CMPR MD 5X2 ELC HKLP STRL (GAUZE/BANDAGES/DRESSINGS) ×2
BNDG COHESIVE 1X5 TAN STRL LF (GAUZE/BANDAGES/DRESSINGS) IMPLANT
BNDG ELASTIC 2 VLCR STRL LF (GAUZE/BANDAGES/DRESSINGS) ×2 IMPLANT
BNDG ESMARK 4X9 LF (GAUZE/BANDAGES/DRESSINGS) IMPLANT
BRUSH SCRUB EZ PLAIN DRY (MISCELLANEOUS) ×2 IMPLANT
CANISTER SUCTION 1200CC (MISCELLANEOUS) ×1 IMPLANT
CLOTH BEACON ORANGE TIMEOUT ST (SAFETY) ×2 IMPLANT
CORDS BIPOLAR (ELECTRODE) ×2 IMPLANT
COVER MAYO STAND STRL (DRAPES) ×2 IMPLANT
COVER TABLE BACK 60X90 (DRAPES) ×2 IMPLANT
CUFF TOURNIQUET SINGLE 18IN (TOURNIQUET CUFF) ×1 IMPLANT
DECANTER SPIKE VIAL GLASS SM (MISCELLANEOUS) IMPLANT
DRAPE EXTREMITY T 121X128X90 (DRAPE) ×2 IMPLANT
DRAPE OEC MINIVIEW 54X84 (DRAPES) ×2 IMPLANT
DRAPE SURG 17X23 STRL (DRAPES) ×2 IMPLANT
DRILL BIT 1.3MM (BIT) ×2
GLOVE BIO SURGEON STRL SZ 6.5 (GLOVE) ×2 IMPLANT
GLOVE BIOGEL M STRL SZ7.5 (GLOVE) ×2 IMPLANT
GLOVE BIOGEL PI IND STRL 7.0 (GLOVE) IMPLANT
GLOVE BIOGEL PI INDICATOR 7.0 (GLOVE) ×1
GLOVE ORTHO TXT STRL SZ7.5 (GLOVE) ×2 IMPLANT
GOWN PREVENTION PLUS XLARGE (GOWN DISPOSABLE) ×2 IMPLANT
GOWN PREVENTION PLUS XXLARGE (GOWN DISPOSABLE) ×4 IMPLANT
NEEDLE 27GAX1X1/2 (NEEDLE) ×1 IMPLANT
NS IRRIG 1000ML POUR BTL (IV SOLUTION) ×2 IMPLANT
PACK BASIN DAY SURGERY FS (CUSTOM PROCEDURE TRAY) ×2 IMPLANT
PAD CAST 3X4 CTTN HI CHSV (CAST SUPPLIES) ×1 IMPLANT
PAD CAST 4YDX4 CTTN HI CHSV (CAST SUPPLIES) IMPLANT
PADDING CAST ABS 4INX4YD NS (CAST SUPPLIES) ×1
PADDING CAST ABS COTTON 4X4 ST (CAST SUPPLIES) ×1 IMPLANT
PADDING CAST COTTON 3X4 STRL (CAST SUPPLIES) ×4
PADDING CAST COTTON 4X4 STRL (CAST SUPPLIES)
PADDING UNDERCAST 2  STERILE (CAST SUPPLIES) IMPLANT
PLATE T 1.3 3H HEAD/8H SFT (Plate) IMPLANT
PLATE Y 1.3 3H HEAD/8H SFT (Plate) IMPLANT
PLATE-T 1.3 3H HEAD/8H SFT (Plate) ×2 IMPLANT
PLATE-Y 1.3 3H HEAD/8H SFT (Plate) ×2 IMPLANT
SCREW SELF TAP CORTEX 1.0 6MM (Screw) ×4 IMPLANT
SCREW SELF TAP CORTEX 1.0 8MM (Screw) ×1 IMPLANT
SCREW SELF TAP CORTEX 1.0 9MM (Screw) ×2 IMPLANT
SPLINT PLASTER CAST XFAST 3X15 (CAST SUPPLIES) IMPLANT
SPLINT PLASTER EXTRA FAST 3X15 (CAST SUPPLIES) ×12
SPLINT PLASTER GYPS XFAST 3X15 (CAST SUPPLIES) IMPLANT
SPLINT PLASTER XTRA FASTSET 3X (CAST SUPPLIES)
SPONGE GAUZE 4X4 12PLY (GAUZE/BANDAGES/DRESSINGS) ×2 IMPLANT
STOCKINETTE 4X48 STRL (DRAPES) ×2 IMPLANT
STRIP CLOSURE SKIN 1/2X4 (GAUZE/BANDAGES/DRESSINGS) ×1 IMPLANT
SUCTION FRAZIER TIP 10 FR DISP (SUCTIONS) IMPLANT
SUT ETHILON 4 0 PS 2 18 (SUTURE) IMPLANT
SUT MERSILENE 4 0 P 3 (SUTURE) IMPLANT
SUT PROLENE 3 0 PS 2 (SUTURE) IMPLANT
SUT PROLENE 4 0 P 3 18 (SUTURE) ×1 IMPLANT
SUT VIC AB 4-0 P-3 18XBRD (SUTURE) IMPLANT
SUT VIC AB 4-0 P3 18 (SUTURE) ×2
SYR 3ML 23GX1 SAFETY (SYRINGE) IMPLANT
SYR BULB 3OZ (MISCELLANEOUS) ×2 IMPLANT
SYR CONTROL 10ML LL (SYRINGE) IMPLANT
TOWEL OR 17X24 6PK STRL BLUE (TOWEL DISPOSABLE) ×2 IMPLANT
TRAY DSU PREP LF (CUSTOM PROCEDURE TRAY) ×2 IMPLANT
TUBE CONNECTING 20X1/4 (TUBING) IMPLANT
UNDERPAD 30X30 INCONTINENT (UNDERPADS AND DIAPERS) ×2 IMPLANT
WATER STERILE IRR 1000ML POUR (IV SOLUTION) ×2 IMPLANT

## 2011-08-27 NOTE — Anesthesia Postprocedure Evaluation (Signed)
  Anesthesia Post-op Note  Patient: Catherine Clay  Procedure(s) Performed:  OPEN REDUCTION INTERNAL FIXATION (ORIF) FINGER WITH RADIAL BONE GRAFT - OPEN REDUCTION INTERNAL FIXATION LEFT SMALL PROXIMAL PHALYNX  Patient Location: PACU  Anesthesia Type: General  Level of Consciousness: awake and alert   Airway and Oxygen Therapy: Patient Spontanous Breathing  Post-op Pain: mild  Post-op Assessment: Post-op Vital signs reviewed, Patient's Cardiovascular Status Stable, Respiratory Function Stable and Patent Airway  Post-op Vital Signs: Reviewed and stable  Complications: No apparent anesthesia complications

## 2011-08-27 NOTE — Op Note (Signed)
OP NOTE DICTATED:  08/27/11 161096

## 2011-08-27 NOTE — Anesthesia Procedure Notes (Signed)
Procedure Name: LMA Insertion Date/Time: 08/27/2011 11:59 AM Performed by: Radford Pax Pre-anesthesia Checklist: Patient identified, Emergency Drugs available, Suction available, Patient being monitored and Timeout performed Patient Re-evaluated:Patient Re-evaluated prior to inductionOxygen Delivery Method: Circle System Utilized Preoxygenation: Pre-oxygenation with 100% oxygen Intubation Type: IV induction Ventilation: Mask ventilation without difficulty LMA: LMA inserted LMA Size: 3.0 Number of attempts: 1 (atraumatic) Airway Equipment and Method: bite block Placement Confirmation: positive ETCO2 Tube secured with: Tape Dental Injury: Teeth and Oropharynx as per pre-operative assessment

## 2011-08-27 NOTE — Transfer of Care (Signed)
Immediate Anesthesia Transfer of Care Note  Patient: Catherine Clay  Procedure(s) Performed:  OPEN REDUCTION INTERNAL FIXATION (ORIF) FINGER WITH RADIAL BONE GRAFT - OPEN REDUCTION INTERNAL FIXATION LEFT SMALL PROXIMAL PHALYNX  Patient Location: PACU  Anesthesia Type: General  Level of Consciousness: awake, alert , oriented and patient cooperative  Airway & Oxygen Therapy: Patient Spontanous Breathing and Patient connected to face mask oxygen  Post-op Assessment: Report given to PACU RN and Post -op Vital signs reviewed and stable  Post vital signs: Reviewed and stable Filed Vitals:   08/27/11 0925  BP: 111/72  Pulse: 57  Temp: 36.5 C  Resp: 18    Complications: No apparent anesthesia complications
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
## 2011-08-27 NOTE — Anesthesia Preprocedure Evaluation (Signed)
Anesthesia Evaluation  Patient identified by MRN, date of birth, ID band  Reviewed: Allergy & Precautions, H&P , NPO status , Patient's Chart, lab work & pertinent test results  Airway Mallampati: I TM Distance: >3 FB Neck ROM: Full    Dental No notable dental hx. (+) Teeth Intact   Pulmonary neg pulmonary ROS,  clear to auscultation  Pulmonary exam normal       Cardiovascular neg cardio ROS Regular Normal    Neuro/Psych Negative Neurological ROS  Negative Psych ROS   GI/Hepatic negative GI ROS, Neg liver ROS,   Endo/Other  Negative Endocrine ROS  Renal/GU negative Renal ROS  Genitourinary negative   Musculoskeletal   Abdominal   Peds  Hematology negative hematology ROS (+)   Anesthesia Other Findings   Reproductive/Obstetrics negative OB ROS                           Anesthesia Physical Anesthesia Plan  ASA: I  Anesthesia Plan: General   Post-op Pain Management:    Induction: Intravenous  Airway Management Planned: LMA  Additional Equipment:   Intra-op Plan:   Post-operative Plan: Extubation in OR  Informed Consent: I have reviewed the patients History and Physical, chart, labs and discussed the procedure including the risks, benefits and alternatives for the proposed anesthesia with the patient or authorized representative who has indicated his/her understanding and acceptance.     Plan Discussed with: CRNA  Anesthesia Plan Comments:         Anesthesia Quick Evaluation

## 2011-08-27 NOTE — Brief Op Note (Signed)
08/27/2011  1:15 PM  PATIENT:  Catherine Clay  26 y.o. female  PRE-OPERATIVE DIAGNOSIS:  fracture left small finger proximal phalanx  POST-OPERATIVE DIAGNOSIS:  Comminuted, shortened,malrotated fracture of left small proximal phalanx   PROCEDURE:  Procedure(s): OPEN REDUCTION INTERNAL FIXATION (ORIF) LEFT SMALL FINGER PROXIMAL PHALANX WITH 7 HOLE ASIF 1.3 MM PLATE  SURGEON:  Surgeon(s): Wyn Forster., MD  PHYSICIAN ASSISTANT:   ASSISTANTS: Mallory Shirk.A-C    ANESTHESIA:   general  EBL:  Total I/O In: 1000 [I.V.:1000] Out: -   BLOOD ADMINISTERED:none  DRAINS: none   LOCAL MEDICATIONS USED:  LIDOCAINE 4CC 2%  SPECIMEN:  No Specimen  DISPOSITION OF SPECIMEN:  N/A  COUNTS:  YES  TOURNIQUET:  * Missing tourniquet times found for documented tourniquets in log:  [id number] *  DICTATION: .Other Dictation: Dictation Number [id number]  PLAN OF CARE: Discharge to home after PACU  PATIENT DISPOSITION:  PACU - hemodynamically stable.   Delay start of Pharmacological VTE agent (>24hrs) due to surgical blood loss or risk of bleeding:  NO/NOT APPLICABLE
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
## 2017-05-02 ENCOUNTER — Emergency Department (HOSPITAL_COMMUNITY)
Admission: EM | Admit: 2017-05-02 | Discharge: 2017-05-03 | Disposition: A | Discharge status: Home / Self Care | Payer: BC Managed Care – PPO | Attending: Emergency Medicine | Admitting: Emergency Medicine

## 2017-05-02 ENCOUNTER — Encounter (HOSPITAL_COMMUNITY): Payer: Self-pay

## 2017-05-02 DIAGNOSIS — R42 Dizziness and giddiness: Secondary | ICD-10-CM

## 2017-05-02 DIAGNOSIS — R519 Headache, unspecified: Secondary | ICD-10-CM

## 2017-05-02 DIAGNOSIS — M542 Cervicalgia: Secondary | ICD-10-CM | POA: Insufficient documentation

## 2017-05-02 DIAGNOSIS — R1110 Vomiting, unspecified: Secondary | ICD-10-CM | POA: Insufficient documentation

## 2017-05-02 DIAGNOSIS — R51 Headache: Secondary | ICD-10-CM | POA: Diagnosis present

## 2017-05-02 DIAGNOSIS — Z79899 Other long term (current) drug therapy: Secondary | ICD-10-CM | POA: Diagnosis not present

## 2017-05-02 HISTORY — DX: Irritable bowel syndrome, unspecified: K58.9

## 2017-05-02 LAB — URINALYSIS, ROUTINE W REFLEX MICROSCOPIC
BILIRUBIN URINE: NEGATIVE
GLUCOSE, UA: NEGATIVE mg/dL
HGB URINE DIPSTICK: NEGATIVE
KETONES UR: 5 mg/dL — AB
Leukocytes, UA: NEGATIVE
Nitrite: NEGATIVE
PROTEIN: NEGATIVE mg/dL
Specific Gravity, Urine: 1.023 (ref 1.005–1.030)
pH: 5 (ref 5.0–8.0)

## 2017-05-02 LAB — CBC
HCT: 38.6 % (ref 36.0–46.0)
Hemoglobin: 13.6 g/dL (ref 12.0–15.0)
MCH: 31.7 pg (ref 26.0–34.0)
MCHC: 35.2 g/dL (ref 30.0–36.0)
MCV: 90 fL (ref 78.0–100.0)
PLATELETS: 287 10*3/uL (ref 150–400)
RBC: 4.29 MIL/uL (ref 3.87–5.11)
RDW: 11.7 % (ref 11.5–15.5)
WBC: 6.2 10*3/uL (ref 4.0–10.5)

## 2017-05-02 LAB — BASIC METABOLIC PANEL
Anion gap: 9 (ref 5–15)
BUN: 7 mg/dL (ref 6–20)
CALCIUM: 8.7 mg/dL — AB (ref 8.9–10.3)
CO2: 20 mmol/L — ABNORMAL LOW (ref 22–32)
CREATININE: 0.75 mg/dL (ref 0.44–1.00)
Chloride: 100 mmol/L — ABNORMAL LOW (ref 101–111)
GFR calc Af Amer: >60 mL/min (ref >60)
GFR calc non Af Amer: >60 mL/min (ref >60)
Glucose, Bld: 108 mg/dL — ABNORMAL HIGH (ref 65–99)
Potassium: 3.6 mmol/L (ref 3.5–5.1)
SODIUM: 129 mmol/L — AB (ref 135–145)

## 2017-05-02 LAB — HCG, QUANTITATIVE, PREGNANCY: hCG, Beta Chain, Quant, S: <1 m[IU]/mL (ref <5)

## 2017-05-02 LAB — CBG MONITORING, ED: GLUCOSE-CAPILLARY: 106 mg/dL — AB (ref 65–99)

## 2017-05-02 NOTE — ED Triage Notes (Signed)
Pt states that she woke up today not feeling well, took some sinus medication, around 2pm began to have dizziness along with a frontal migraine and vomited x 1. Denies photophobia.

## 2017-05-03 MED ORDER — PROCHLORPERAZINE EDISYLATE 5 MG/ML IJ SOLN
5.0000 mg | Freq: Once | INTRAMUSCULAR | Status: AC
  Administered 2017-05-03: 5 mg via INTRAVENOUS
  Filled 2017-05-03: qty 2

## 2017-05-03 MED ORDER — SODIUM CHLORIDE 0.9 % IV BOLUS (SEPSIS)
1000.0000 mL | Freq: Once | INTRAVENOUS | Status: AC
  Administered 2017-05-03: 1000 mL via INTRAVENOUS

## 2017-05-03 MED ORDER — KETOROLAC TROMETHAMINE 30 MG/ML IJ SOLN
30.0000 mg | Freq: Once | INTRAMUSCULAR | Status: AC
  Administered 2017-05-03: 30 mg via INTRAVENOUS
  Filled 2017-05-03: qty 1

## 2017-05-03 MED ORDER — DIPHENHYDRAMINE HCL 50 MG/ML IJ SOLN
25.0000 mg | Freq: Once | INTRAMUSCULAR | Status: AC
  Administered 2017-05-03: 25 mg via INTRAVENOUS
  Filled 2017-05-03: qty 1

## 2017-05-03 NOTE — ED Provider Notes (Signed)
[facility] DEPT Provider Note   CSN: [id number] Arrival date & time: 05/02/17  1944     History   Chief Complaint Chief Complaint  Patient presents with  . Migraine  . Dizziness    HPI Catherine Clay is a 31 y.o. female With history of IBS who presents with temporal headache, generalized fatigue, intermittent dizziness beginning today. Patient reports waking up not feeling well and feeling achy. She then took a Sudafed after waking up. Following that she began feeling some intermittent dizziness and a temporal/frontal migraine that gradually increased in severity. She describes her dizziness as feeling seasick. She had one episode of vomiting, Which did make her feel better. She reports having this about one year ago when she was diagnosed with fluid behind her ear. She states it feels about the same. She has no history of migraines. She denies any photophobia, vision changes, fever, chest pain, shortness of breath, nasal congestion, abdominal pain, urinary symptoms. She has also taken ibuprofen and Tylenol at home without significant relief.  HPI  Past Medical History:  Diagnosis Date  . IBS (irritable bowel syndrome)     Patient Active Problem List   Diagnosis Date Noted  . Fatigue 02/26/2011  . SHIN SPLINTS 12/30/2009  . Pain in limb 09/30/2009  . ITBS, RIGHT KNEE 10/24/2008  . BUNIONS, BILATERAL 07/27/2008  . STRESS FRACTURE, TIBIA 03/20/2008  . STRESS FRACTURE OF THE METATARSALS 03/20/2008  . UNEQUAL LEG LENGTH 03/20/2008  . SCOLIOSIS, LUMBAR SPINE 03/20/2008    Past Surgical History:  Procedure Laterality Date  . WISDOM TOOTH EXTRACTION      OB History    No data available       Home Medications    Prior to Admission medications   Medication Sig Start Date End Date Taking? Authorizing Provider  desogestrel-ethinyl estradiol (KARIVA) 0.15-0.02/0.01 MG (21/5) per tablet Take 1 tablet by mouth daily.      [provider]  fenoprofen (NALFON) 600 MG  TABS Take 600 mg by mouth once.    [provider]  Multiple Vitamins-Minerals (MULTIVITAMIN WITH MINERALS) tablet Take 1 tablet by mouth daily.      [provider]  oxyCODONE-acetaminophen (PERCOCET) 5-325 MG per tablet 1 or 2 tabs by mouth every 4 hours as needed for pain 08/27/11   Dasnoit, Herbie Baltimore, PA-C    Family History No family history on file.  Social History Social History  Substance Use Topics  . Smoking status: Never Smoker  . Smokeless tobacco: Never Used  . Alcohol use No     Allergies   Ceclor [cefaclor]   Review of Systems Review of Systems  Constitutional: Negative for chills and fever.  HENT: Negative for ear pain, facial swelling and sore throat.   Eyes: Negative for photophobia and visual disturbance.  Respiratory: Negative for shortness of breath.   Cardiovascular: Negative for chest pain.  Gastrointestinal: Positive for vomiting. Negative for abdominal pain and nausea.  Genitourinary: Negative for dysuria.  Musculoskeletal: Positive for neck pain (soreness). Negative for back pain and neck stiffness.  Skin: Negative for rash and wound.  Neurological: Positive for dizziness and headaches.  Psychiatric/Behavioral: The patient is not nervous/anxious.      Physical Exam Updated Vital Signs BP 123/72   Pulse 64   Temp 98.3 F (36.8 C)   Resp 16   Ht 5\' 4"  (1.626 m)   Wt 52.2 kg (115 lb)   LMP 04/11/2017   SpO2 99%   BMI 19.74 kg/m  Physical Exam  Constitutional: She appears well-developed and well-nourished. No distress.  HENT:  Head: Normocephalic and atraumatic.  Right Ear: Tympanic membrane normal.  Left Ear: A middle ear effusion is present.  Mouth/Throat: Oropharynx is clear and moist. No oropharyngeal exudate.  Eyes: Pupils are equal, round, and reactive to light. Conjunctivae and EOM are normal. Right eye exhibits no discharge. Left eye exhibits no discharge. No scleral icterus.  Neck: Normal range of motion and full  passive range of motion without pain. Neck supple. Muscular tenderness present. No thyromegaly present.    Cardiovascular: Normal rate, regular rhythm, normal heart sounds and intact distal pulses.  Exam reveals no gallop and no friction rub.   No murmur heard. Pulmonary/Chest: Effort normal and breath sounds normal. No stridor. No respiratory distress. She has no wheezes. She has no rales.  Abdominal: Soft. Bowel sounds are normal. She exhibits no distension. There is no tenderness. There is no rebound and no guarding.  Musculoskeletal: She exhibits no edema.  Lymphadenopathy:    She has no cervical adenopathy.  Neurological: She is alert. Coordination normal.  CN 3-12 intact; normal sensation throughout; 5/5 strength in all 4 extremities; equal bilateral grip strength  Skin: Skin is warm and dry. No rash noted. She is not diaphoretic. No pallor.  Psychiatric: She has a normal mood and affect.  Nursing note and vitals reviewed.    ED Treatments / Results  Labs (all labs ordered are listed, but only abnormal results are displayed) Labs Reviewed  BASIC METABOLIC PANEL - Abnormal; Notable for the following:       Result Value   Sodium 129 (*)    Chloride 100 (*)    CO2 20 (*)    Glucose, Bld 108 (*)    Calcium 8.7 (*)    All other components within normal limits  URINALYSIS, ROUTINE W REFLEX MICROSCOPIC - Abnormal; Notable for the following:    Ketones, ur 5 (*)    All other components within normal limits  CBG MONITORING, ED - Abnormal; Notable for the following:    Glucose-Capillary 106 (*)    All other components within normal limits  CBC  HCG, QUANTITATIVE, PREGNANCY    EKG  EKG Interpretation  Date/Time:  Monday May 03 2017 01:40:48 EDT Ventricular Rate:  59 PR Interval:  120 QRS Duration: 96 QT Interval:  450 QTC Calculation: 445 R Axis:   84 Text Interpretation:  Sinus bradycardia Otherwise normal ECG When compared with ECG of 05/02/2017, Artifact is no  longer present Confirmed by Delora Fuel ([id number]) on 05/03/2017 1:47:46 AM       Radiology No results found.  Procedures Procedures (including critical care time)  Medications Ordered in ED Medications  sodium chloride 0.9 % bolus 1,000 mL (0 mLs Intravenous Stopped 05/03/17 0210)  ketorolac (TORADOL) 30 MG/ML injection 30 mg (30 mg Intravenous Given 05/03/17 0042)  prochlorperazine (COMPAZINE) injection 5 mg (5 mg Intravenous Given 05/03/17 0042)  diphenhydrAMINE (BENADRYL) injection 25 mg (25 mg Intravenous Given 05/03/17 0042)     Initial Impression / Assessment and Plan / ED Course  I have reviewed the triage vital signs and the nursing notes.  Pertinent labs & imaging results that were available during my care of the patient were reviewed by me and considered in my medical decision making (see chart for details).     Patient with headache and dizziness mostly resolved with Toradol, Compazine, Benadryl, fluids. I suspect peripheral cause of dizziness, as patient does have air-fluid levels  in her left ear. CBC unremarkable. BMP shows sodium 129, chloride 100, glucose 108. UA shows 5 ketones only. Pregnancy negative. Patient with probable tension headache versus congestion. I advised continuation with Sudafed, Flonase, as well as ibuprofen and Tylenol. I discussed stretching and heat to treat tension. Patient with normal neuro exam. Patient can put her chin to chest without difficulty.  Strict return precautions given. Patient advised to follow up with PCP if symptoms are continuing. Patient understands and agrees with plan. Patient vitals stable throughout ED course and discharged in satisfactory condition.   Final Clinical Impressions(s) / ED Diagnoses   Final diagnoses:  Bad headache  Dizziness    New Prescriptions Discharge Medication List as of 05/03/2017  2:09 AM       Frederica Kuster, PA-C 45/85/92 [id number]    Delora Fuel, MD 62/86/38 [phone]

## 2017-05-03 NOTE — Discharge Instructions (Signed)
Continue taking the pseudoephedrine as prescribed over-the-counter. You can also try Flonase as prescribed over-the-counter. Alternate ibuprofen and Tylenol as prescribed over-the-counter. Attempt the gentle neck stretches I suggested a few times daily, holding for 30 secs each. You may also find a massage helpful. Please follow-up with your primary care provider if your symptoms are continuing. Please return to the emergency department if you develop any new or worsening symptoms, such as severe headache, dizziness, passing out, seizure activity, confusion, or any other concerning symptoms.

## 2018-01-27 DIAGNOSIS — Z23 Encounter for immunization: Secondary | ICD-10-CM | POA: Diagnosis not present

## 2018-01-27 DIAGNOSIS — Z0000 Encounter for general adult medical examination without abnormal findings: Secondary | ICD-10-CM | POA: Diagnosis not present

## 2018-05-04 DIAGNOSIS — R946 Abnormal results of thyroid function studies: Secondary | ICD-10-CM | POA: Diagnosis not present

## 2018-05-04 DIAGNOSIS — Z6820 Body mass index (BMI) 20.0-20.9, adult: Secondary | ICD-10-CM | POA: Diagnosis not present

## 2018-05-04 DIAGNOSIS — Z1389 Encounter for screening for other disorder: Secondary | ICD-10-CM | POA: Diagnosis not present

## 2018-07-29 ENCOUNTER — Encounter: Payer: BC Managed Care – PPO | Admitting: Certified Nurse Midwife

## 2018-08-09 ENCOUNTER — Ambulatory Visit (INDEPENDENT_AMBULATORY_CARE_PROVIDER_SITE_OTHER): Payer: BC Managed Care – PPO | Admitting: Certified Nurse Midwife

## 2018-08-09 ENCOUNTER — Encounter: Payer: Self-pay | Admitting: Certified Nurse Midwife

## 2018-08-09 ENCOUNTER — Other Ambulatory Visit: Payer: Self-pay

## 2018-08-09 ENCOUNTER — Other Ambulatory Visit (HOSPITAL_COMMUNITY)
Admission: RE | Admit: 2018-08-09 | Discharge: 2018-08-09 | Disposition: A | Discharge status: Home / Self Care | Payer: BC Managed Care – PPO | Source: Ambulatory Visit | Attending: Certified Nurse Midwife | Admitting: Certified Nurse Midwife

## 2018-08-09 VITALS — BP 120/68 | HR 64 | Resp 16 | Ht 63.75 in | Wt 116.0 lb

## 2018-08-09 DIAGNOSIS — Z01419 Encounter for gynecological examination (general) (routine) without abnormal findings: Secondary | ICD-10-CM | POA: Diagnosis not present

## 2018-08-09 DIAGNOSIS — Z30011 Encounter for initial prescription of contraceptive pills: Secondary | ICD-10-CM

## 2018-08-09 DIAGNOSIS — L900 Lichen sclerosus et atrophicus: Secondary | ICD-10-CM | POA: Diagnosis not present

## 2018-08-09 DIAGNOSIS — N904 Leukoplakia of vulva: Secondary | ICD-10-CM | POA: Insufficient documentation

## 2018-08-09 DIAGNOSIS — Z124 Encounter for screening for malignant neoplasm of cervix: Secondary | ICD-10-CM | POA: Insufficient documentation

## 2018-08-09 MED ORDER — DESOGESTREL-ETHINYL ESTRADIOL 0.15-0.02/0.01 MG (21/5) PO TABS: 1.0000 | ORAL_TABLET | Freq: Every day | ORAL | 4 refills | Status: DC

## 2018-08-09 NOTE — Progress Notes (Signed)
32 y.o. G0P0000 Married  Caucasian Fe here to establish gyn care and for annual exam. Periods are monthly, duration 3-4 days, with day 1-2 heavy with cramping with OTC Advil with good results and using heating pad also. History of Lichen Sclerosis and treats as occurs and feels it is not clearing this time. Sees Urgent care if needed. Patient is Pharmacist, hospital. No other health issues today.  Patient's last menstrual period was 07/29/2018 (exact date).          Sexually active: Yes.    The current method of family planning is OCP (estrogen/progesterone).    Exercising: Yes.    run/ strengthening exercise Smoker:  no  Review of Systems  Constitutional: Negative.   HENT: Negative.   Eyes: Negative.   Respiratory: Negative.   Cardiovascular: Negative.   Gastrointestinal: Negative.   Genitourinary: Negative.   Musculoskeletal: Negative.   Skin: Negative.   Neurological: Negative.   Endo/Heme/Allergies: Negative.   Psychiatric/Behavioral: Negative.     Health Maintenance: Pap:  2017 neg per patient History of Abnormal Pap: no MMG:  none Self Breast exams: occ Colonoscopy:  2015 BMD:   2009 TDaP:  2019 Shingles: no Pneumonia: no Hep C and HIV: not done Labs: if needed   reports that she has never smoked. She has never used smokeless tobacco. She reports that she does not drink alcohol or use drugs.  Past Medical History:  Diagnosis Date  . Broken finger    left pinky  . IBS (irritable bowel syndrome)   . Neuroma   . Osteoporosis   . Thyroid disease     Past Surgical History:  Procedure Laterality Date  . COLONOSCOPY    . WISDOM TOOTH EXTRACTION      Current Outpatient Medications  Medication Sig Dispense Refill  . B Complex Vitamins (B COMPLEX PO) Take by mouth.    Marland Kitchen CALCIUM PO Take by mouth.    . desogestrel-ethinyl estradiol (KARIVA) 0.15-0.02/0.01 MG (21/5) per tablet Take 1 tablet by mouth daily.      . fexofenadine (ALLEGRA) 180 MG tablet Take 180 mg by mouth daily.     . fluticasone (FLONASE) 50 MCG/ACT nasal spray Place into both nostrils daily.    . Multiple Vitamins-Minerals (MULTIVITAMIN WITH MINERALS) tablet Take 1 tablet by mouth daily.      . Polyethylene Glycol 3350 (MIRALAX PO) Take by mouth.     No current facility-administered medications for this visit.     Family History  Problem Relation Age of Onset  . Cervical cancer Mother   . Hypertension Father   . Thyroid disease Father        hypothyroid  . Thyroid disease Sister        hypothyroid  . Thyroid disease Maternal Aunt        hypothyroid  . Skin cancer Maternal Grandmother   . Colon cancer Paternal Grandfather     ROS:  Pertinent items are noted in HPI.  Otherwise, a comprehensive ROS was negative.  Exam:   BP 120/68   Pulse 64   Resp 16   Ht 5' 3.75" (1.619 m)   Wt 116 lb (52.6 kg)   LMP 07/29/2018 (Exact Date)   BMI 20.07 kg/m  Height: 5' 3.75" (161.9 cm) Ht Readings from Last 3 Encounters:  08/09/18 5' 3.75" (1.619 m)  05/02/17 5\' 4"  (1.626 m)  08/26/11 5\' 4"  (1.626 m)    General appearance: alert, cooperative and appears stated age Head: Normocephalic, without obvious abnormality, atraumatic  Neck: no adenopathy, supple, symmetrical, trachea midline and thyroid normal to inspection and palpation Lungs: clear to auscultation bilaterally Breasts: normal appearance, no masses or tenderness, No nipple retraction or dimpling, No nipple discharge or bleeding, No axillary or supraclavicular adenopathy Heart: regular rate and rhythm Abdomen: soft, non-tender; no masses,  no organomegaly Extremities: extremities normal, atraumatic, no cyanosis or edema Skin: Skin color, texture, turgor normal. No rashes or lesions Lymph nodes: Cervical, supraclavicular, and axillary nodes normal. No abnormal inguinal nodes palpated Neurologic: Grossly normal   Pelvic: External genitalia:  no lesions. Area consistent with Lichen Sclerosis noted at clitoral hood and small area around,  with pigmentation changes noted with decrease pink in color              Urethra:  normal appearing urethra with no masses, tenderness or lesions              Bartholin's and Skene's: normal                 Vagina: normal appearing vagina with normal color and discharge, no lesions              Cervix: no cervical motion tenderness, no lesions, nulliparous appearance and stenotic os with 3 attempts for endocervical cells for pap              Pap taken: Yes.   Bimanual Exam:  Uterus:  normal size, contour, position, consistency, mobility, non-tender and anteverted              Adnexa: normal adnexa and no mass, fullness, tenderness               Rectovaginal: Confirms               Anus:  normal sphincter tone, no lesions  Chaperone present: yes  A:  Well Woman with normal exam  Contraception OCP desired  History of Lichen Sclerosis with current flare    P:   Reviewed health and wellness pertinent to exam  Risks/benefits/warning signs with OCP use. Desires Rx.  Rx Minerva Fester see order with instructions  Discussed Lichen finding and need to treat effectively, instead every so often for best results. Instructions given for twice daily use of Clobetasol( she has RX) x 4 weeks and once daily for 2 weeks and check with mirror if no change needs to come in.  Pap smear: yes   counseled on breast self exam, use and side effects of OCP's, adequate intake of calcium and vitamin D, diet and exercise  return annually or prn  An After Visit Summary was printed and given to the patient.

## 2018-08-09 NOTE — Patient Instructions (Signed)

## 2018-08-12 LAB — CYTOLOGY - PAP
ADEQUACY: ABSENT
Diagnosis: NEGATIVE
HPV: NOT DETECTED

## 2018-08-25 ENCOUNTER — Telehealth: Payer: Self-pay | Admitting: Certified Nurse Midwife

## 2018-08-25 MED ORDER — CLOBETASOL PROPIONATE 0.05 % EX OINT: TOPICAL_OINTMENT | CUTANEOUS | 0 refills | Status: DC

## 2018-08-25 NOTE — Telephone Encounter (Signed)
Patient calling for refill on clobetasol propionate cream. States it was prescribed by another doctor, but Jackelyn Poling is aware that she was using it.

## 2018-08-25 NOTE — Telephone Encounter (Signed)
Reviewed OV note dated 08/09/18.   Instructions given for twice daily use of Clobetasol( she has RX) x 4 weeks and once daily for 2 weeks and check with mirror if no change needs to come in. RX for Clobetasol ointment 0.05% #60g/0RF to pharmacy on file.   Call to patient, left detailed message, ok per dpr. Advised as seen above, f/u with pharmacy for filling. Return call to office if any additional questions.   Routing to provider for final review. Patient is agreeable to disposition. Will close encounter.

## 2018-08-25 NOTE — Telephone Encounter (Signed)
Last AEX 58/25/18 Hx of Lichen Sclerosis Requesting Rx for clobetasol.   Routing to Melvia Heaps, CNM to advise on RX.

## 2018-08-25 NOTE — Telephone Encounter (Signed)
[facility] for clobetasol Rx will need with instructions in my last  Note.

## 2018-10-14 DIAGNOSIS — G43109 Migraine with aura, not intractable, without status migrainosus: Secondary | ICD-10-CM | POA: Diagnosis not present

## 2018-11-15 DIAGNOSIS — R946 Abnormal results of thyroid function studies: Secondary | ICD-10-CM | POA: Diagnosis not present

## 2018-12-12 ENCOUNTER — Telehealth: Payer: Self-pay | Admitting: Neurology

## 2018-12-12 ENCOUNTER — Encounter: Payer: Self-pay | Admitting: Neurology

## 2018-12-12 NOTE — Telephone Encounter (Signed)
Pt consented to a Virtual Visit and for the insurance to be billed as such. Cell phone for link has been confirmed.

## 2018-12-12 NOTE — Telephone Encounter (Signed)
Spoke with pt and discussed virtual visit is on [email or web address]. She prefers to use her computer with camera so she provided her email address for the link. [email or web address]. Confirmed pt using 2 identifiers and updated her chart (meds, PMH confirmed with no changes). No family hx of migraines. Pt aware she will receive a call about 1:00 pm on Thurs 5/7 to check in. Advised pt to please click the link and join the virtual waiting room at about 1:20 pm. She verbalized understanding and appreciation.

## 2018-12-15 ENCOUNTER — Ambulatory Visit (INDEPENDENT_AMBULATORY_CARE_PROVIDER_SITE_OTHER): Payer: BC Managed Care – PPO | Admitting: Neurology

## 2018-12-15 ENCOUNTER — Other Ambulatory Visit: Payer: Self-pay

## 2018-12-15 DIAGNOSIS — R51 Headache with orthostatic component, not elsewhere classified: Secondary | ICD-10-CM

## 2018-12-15 DIAGNOSIS — G43009 Migraine without aura, not intractable, without status migrainosus: Secondary | ICD-10-CM

## 2018-12-15 DIAGNOSIS — R946 Abnormal results of thyroid function studies: Secondary | ICD-10-CM | POA: Diagnosis not present

## 2018-12-15 DIAGNOSIS — G441 Vascular headache, not elsewhere classified: Secondary | ICD-10-CM | POA: Diagnosis not present

## 2018-12-15 DIAGNOSIS — R519 Headache, unspecified: Secondary | ICD-10-CM

## 2018-12-15 DIAGNOSIS — G4484 Primary exertional headache: Secondary | ICD-10-CM

## 2018-12-15 DIAGNOSIS — H539 Unspecified visual disturbance: Secondary | ICD-10-CM

## 2018-12-15 MED ORDER — ONDANSETRON 4 MG PO TBDP: 4.0000 mg | ORAL_TABLET | Freq: Three times a day (TID) | ORAL | 3 refills | Status: DC | PRN

## 2018-12-15 MED ORDER — RIZATRIPTAN BENZOATE 10 MG PO TBDP: 10.0000 mg | ORAL_TABLET | ORAL | 11 refills | Status: DC | PRN

## 2018-12-15 NOTE — Progress Notes (Signed)
[facility]    Provider:  Dr Jaynee Eagles Requesting Provider: Izora Gala, MD Primary Care Provider:  Izora Gala, MD  CC:  headache  Virtual Visit via Video Note  I connected with@ on 12/18/18 at  1:30 PM EDT by a video enabled telemedicine application and verified that I am speaking with the correct person using two identifiers. Patient is at home and physician is in the office.   I discussed the limitations of evaluation and management by telemedicine and the availability of in person appointments. The patient expressed understanding and agreed to proceed.  Melvenia Beam, MD  HPI:  Catherine Clay is a 33 y.o. female here as requested by Izora Gala, MD for migraines. Started about 3 years ago. Horrible headache in the temples, pressure, nausea, motion sickness, nothing helped (ibuprofen, benadryl), + vomiting. Happened twice a year after it was the worse headache for a week, couldn't move, couldn't read, couldn't look at a screen, blurred vision. She went to the ED and CT was negative of the head. Happened 2-3x that year. This past year in 2019 happened about the same time in September and has been worsening and becoming more frequent it occurred over 3 months from Sept-December. Motion sickness is worse, she feels sick in the car with a headache. It is pounding, pulsating, throbbing, bilaterally. She can't ride in a car without some queasiness and lasts for 20 minutes may not be followed by a headache.  She has blurred vision. Headaches are worse with exertion, valsalva, bending over. No morning or nocturnal headaches. No Fhx of migraines. No other focal neurologic deficits, associated symptoms, inciting events or modifiable factors.  Reviewed notes, labs and imaging from outside physicians, which showed:  Reviewed emergency room notes.  Patient was seen in the emergency room for migraines and dizziness, temporal, also generalized fatigue and intermittent dizziness.   Started with feeling intermittently dizzy with a temporal frontal migraine that gradually increased in severity.  She describes her dizziness as feeling seasick.  She had one episode of vomiting.  She has a history of fluid behind her ears and vertigo.  At that denied she denied any history of migraines.  She denied any photophobia, vision changes, fever, chest pain, shortness of breath or any other associated symptoms.  Ibuprofen and Tylenol did not help.  They noted a middle ear and fusion.  Also muscular tenderness in the neck.  Otherwise neurologic and physical exam were normal.  Her sodium was however 129.  Reviewed CT of the head report September 2018 which was normal except for mild mucosal thickening of the bilateral ethmoid sinuses.  Review of Systems: Patient complains of symptoms per HPI as well as the following symptoms: headaches. Pertinent negatives and positives per HPI. All others negative.   Social History   Socioeconomic History  . Marital status: Married    Spouse name: Not on file  . Number of children: 0  . Years of education: Not on file  . Highest education level: Bachelor's degree (e.g., BA, AB, BS)  Occupational History  . Not on file  Social Needs  . Financial resource strain: Not on file  . Food insecurity:    Worry: Not on file    Inability: Not on file  . Transportation needs:    Medical: Not on file    Non-medical: Not on file  Tobacco Use  . Smoking status: Never Smoker  . Smokeless tobacco: Never Used  Substance and Sexual Activity  .  Alcohol use: No  . Drug use: No  . Sexual activity: Yes    Partners: Male    Birth control/protection: Pill  Lifestyle  . Physical activity:    Days per week: Not on file    Minutes per session: Not on file  . Stress: Not on file  Relationships  . Social connections:    Talks on phone: Not on file    Gets together: Not on file    Attends religious service: Not on file    Active member of club or organization:  Not on file    Attends meetings of clubs or organizations: Not on file    Relationship status: Not on file  . Intimate partner violence:    Fear of current or ex partner: Not on file    Emotionally abused: Not on file    Physically abused: Not on file    Forced sexual activity: Not on file  Other Topics Concern  . Not on file  Social History Narrative   Lives at home with her husband   Currently in the process of buying a home   Right handed    Caffeine: 2 cups of coffee daily     Family History  Problem Relation Age of Onset  . Cervical cancer Mother   . Hypertension Father   . Thyroid disease Father        hypothyroid  . Thyroid disease Sister        hypothyroid  . Thyroid disease Maternal Aunt        hypothyroid  . Skin cancer Maternal Grandmother   . Colon cancer Paternal Grandfather   . Migraines Neg Hx     Past Medical History:  Diagnosis Date  . Broken finger    left pinky  . IBS (irritable bowel syndrome)   . Neuroma   . Osteoporosis   . Thyroid disease     Patient Active Problem List   Diagnosis Date Noted  . Lichen sclerosus of female genitalia 08/09/2018  . Encounter for oral contraception initial prescription 08/09/2018  . Fatigue 02/26/2011  . SHIN SPLINTS 12/30/2009  . Pain in limb 09/30/2009  . ITBS, RIGHT KNEE 10/24/2008  . BUNIONS, BILATERAL 07/27/2008  . STRESS FRACTURE, TIBIA 03/20/2008  . STRESS FRACTURE OF THE METATARSALS 03/20/2008  . UNEQUAL LEG LENGTH 03/20/2008  . SCOLIOSIS, LUMBAR SPINE 03/20/2008    Past Surgical History:  Procedure Laterality Date  . COLONOSCOPY    . FINGER FRACTURE SURGERY Right    5th finger  . WISDOM TOOTH EXTRACTION      Current Outpatient Medications  Medication Sig Dispense Refill  . B Complex Vitamins (B COMPLEX PO) Take by mouth.    Marland Kitchen CALCIUM PO Take by mouth.    . clobetasol ointment (TEMOVATE) 0.05 % Apply pea size amount to vulva BID x 4 weeks, then daily for 2 weeks. 60 g 0  .  desogestrel-ethinyl estradiol (KARIVA,AZURETTE,MIRCETTE) 0.15-0.02/0.01 MG (21/5) tablet Take 1 tablet by mouth daily. 3 Package 4  . fexofenadine (ALLEGRA) 180 MG tablet Take 180 mg by mouth daily.    . fluticasone (FLONASE) 50 MCG/ACT nasal spray Place into both nostrils daily.    . Multiple Vitamins-Minerals (MULTIVITAMIN WITH MINERALS) tablet Take 1 tablet by mouth daily.      . ondansetron (ZOFRAN-ODT) 4 MG disintegrating tablet Take 1 tablet (4 mg total) by mouth every 8 (eight) hours as needed for nausea. May take with Rizatriptan. 30 tablet 3  . Polyethylene Glycol  3350 (MIRALAX PO) Take by mouth.    . rizatriptan (MAXALT-MLT) 10 MG disintegrating tablet Take 1 tablet (10 mg total) by mouth as needed for migraine. May repeat in 2 hours if needed. Take right at onset of migraine. Max 2x a day. May take with ondansetron for nausea. 9 tablet 11   No current facility-administered medications for this visit.     Allergies as of 12/15/2018 - Review Complete 12/12/2018  Allergen Reaction Noted  . Ceclor [cefaclor] Hives 02/26/2011    Vitals: LMP 11/16/2018 (Approximate)  Last Weight:  Wt Readings from Last 1 Encounters:  12/12/18 116 lb (52.6 kg)   Last Height:   Ht Readings from Last 1 Encounters:  12/12/18 5\' 4"  (1.626 m)     Physical exam: Exam:    Physical exam: Exam: Gen: NAD, conversant      CV: attempted, Could not perform over Web Video. Denies palpitations or chest pain or SOB. VS: Breathing at a normal rate. Weight appears within normal limits. Not febrile. Eyes: Conjunctivae clear without exudates or hemorrhage  Neuro: Detailed Neurologic Exam  Speech:    Speech is normal; fluent and spontaneous with normal comprehension.  Cognition:    The patient is oriented to person, place, and time;     recent and remote memory intact;     language fluent;     normal attention, concentration,     fund of knowledge Cranial Nerves:    The pupils are equal, round, and  reactive to light. Attempted, Cannot perform fundoscopic exam. Visual fields are full to finger confrontation. Extraocular movements are intact.  The face is symmetric with normal sensation. The palate elevates in the midline. Hearing intact. Voice is normal. Shoulder shrug is normal. The tongue has normal motion without fasciculations.   Coordination:    Normal finger to nose  Gait:    Normal native gait  Motor Observation:   no involuntary movements noted. Tone:    Appears normal  Posture:    Posture is normal. normal erect    Strength:    Strength is anti-gravity and symmetric in the upper and lower limbs.      Sensation: intact to LT     Reflex Exam:  DTR's:    Attempted, Could not perform over Web Video   Toes: Attempted Could not perform over Web Video  Clonus:   Attempted, Could not perform over Web Video     Assessment/Plan: This is a 33 year old patient with likely migraines.  However given some concerning symptoms she needs a thorough evaluation including MRI of the brain.  - MRI brain due to concerning symptoms of worsening headaches (in frequency, severity with new quality), exertional, headaches, positional headaches,vision changes  to look for space occupying mass, chiari or intracranial hypertension (pseudotumor) or other intracranial etiology.  - Labs: she sees an endocrinologist and he follows her thyroid. Will order cbc and cmp.   - Migraines: Maxalt and Zofran. May use zofran for motion sickness as well.   Discussed: To prevent or relieve headaches, try the following: Cool Compress. Lie down and place a cool compress on your head.  Avoid headache triggers. If certain foods or odors seem to have triggered your migraines in the past, avoid them. A headache diary might help you identify triggers.  Include physical activity in your daily routine. Try a daily walk or other moderate aerobic exercise.  Manage stress. Find healthy ways to cope with the stressors,  such as delegating tasks on your to-do  list.  Practice relaxation techniques. Try deep breathing, yoga, massage and visualization.  Eat regularly. Eating regularly scheduled meals and maintaining a healthy diet might help prevent headaches. Also, drink plenty of fluids.  Follow a regular sleep schedule. Sleep deprivation might contribute to headaches Consider biofeedback. With this mind-body technique, you learn to control certain bodily functions - such as muscle tension, heart rate and blood pressure - to prevent headaches or reduce headache pain.    Proceed to emergency room if you experience new or worsening symptoms or symptoms do not resolve, if you have new neurologic symptoms or if headache is severe, or for any concerning symptom.   Provided education and documentation from American headache Society toolbox including articles on: chronic migraine medication overuse headache, chronic migraines, prevention of migraines, behavioral and other nonpharmacologic treatments for headache.    Follow Up Instructions:    I discussed the assessment and treatment plan with the patient. The patient was provided an opportunity to ask questions and all were answered. The patient agreed with the plan and demonstrated an understanding of the instructions.   The patient was advised to call back or seek an in-person evaluation if the symptoms worsen or if the condition fails to improve as anticipated.  A total of 80 minutes was spent in the care of this patient, facilitated by Video face-to-face(not in person) with this patient. Over half this time was spent on counseling patient on the  1. Migraine without aura and without status migrainosus, not intractable   2. Other vascular headache   3. Positional headache   4. Exertional headache   5. Vision changes   6. Worsening headaches    diagnosis and different diagnostic and therapeutic options, counseling and coordination of care, risks ans benefits of  management, compliance, or risk factor reduction and education.     Orders Placed This Encounter  Procedures  . MR BRAIN W WO CONTRAST  . CBC with Differential/Platelets  . Comprehensive metabolic panel   Meds ordered this encounter  Medications  . rizatriptan (MAXALT-MLT) 10 MG disintegrating tablet    Sig: Take 1 tablet (10 mg total) by mouth as needed for migraine. May repeat in 2 hours if needed. Take right at onset of migraine. Max 2x a day. May take with ondansetron for nausea.    Dispense:  9 tablet    Refill:  11  . ondansetron (ZOFRAN-ODT) 4 MG disintegrating tablet    Sig: Take 1 tablet (4 mg total) by mouth every 8 (eight) hours as needed for nausea. May take with Rizatriptan.    Dispense:  30 tablet    Refill:  3    Cc: Izora Gala, MD,  Patient, No Pcp Per  Sarina Ill, MD  [facility] [address] [place], [place] [address]  Phone [phone] Fax [phone]

## 2018-12-16 LAB — COMPREHENSIVE METABOLIC PANEL
ALT: 16 IU/L (ref 0–32)
AST: 20 IU/L (ref 0–40)
Albumin/Globulin Ratio: 1.3 (ref 1.2–2.2)
Albumin: 4.2 g/dL (ref 3.9–5.0)
Alkaline Phosphatase: 68 IU/L (ref 39–117)
BUN/Creatinine Ratio: 9 (ref 9–23)
BUN: 7 mg/dL (ref 6–20)
Bilirubin Total: 0.3 mg/dL (ref 0.0–1.2)
CO2: 22 mmol/L (ref 20–29)
Calcium: 9.5 mg/dL (ref 8.7–10.2)
Chloride: 102 mmol/L (ref 96–106)
Creatinine, Ser: 0.79 mg/dL (ref 0.57–1.00)
GFR calc Af Amer: 116 mL/min/{1.73_m2} (ref >59)
GFR calc non Af Amer: 101 mL/min/{1.73_m2} (ref >59)
Globulin, Total: 3.2 g/dL (ref 1.5–4.5)
Glucose: 82 mg/dL (ref 65–99)
Potassium: 4.3 mmol/L (ref 3.5–5.2)
Sodium: 137 mmol/L (ref 134–144)
Total Protein: 7.4 g/dL (ref 6.0–8.5)

## 2018-12-16 LAB — CBC WITH DIFFERENTIAL/PLATELET
Basophils Absolute: 0.1 10*3/uL (ref 0.0–0.2)
Basos: 1 %
EOS (ABSOLUTE): 0.3 10*3/uL (ref 0.0–0.4)
Eos: 5 %
Hematocrit: 40.3 % (ref 34.0–46.6)
Hemoglobin: 13.8 g/dL (ref 11.1–15.9)
Immature Grans (Abs): 0 10*3/uL (ref 0.0–0.1)
Immature Granulocytes: 0 %
Lymphocytes Absolute: 2.2 10*3/uL (ref 0.7–3.1)
Lymphs: 37 %
MCH: 31.6 pg (ref 26.6–33.0)
MCHC: 34.2 g/dL (ref 31.5–35.7)
MCV: 92 fL (ref 79–97)
Monocytes Absolute: 0.7 10*3/uL (ref 0.1–0.9)
Monocytes: 13 %
Neutrophils Absolute: 2.7 10*3/uL (ref 1.4–7.0)
Neutrophils: 44 %
Platelets: 286 10*3/uL (ref 150–450)
RBC: 4.37 x10E6/uL (ref 3.77–5.28)
RDW: 11.7 % (ref 11.7–15.4)
WBC: 5.9 10*3/uL (ref 3.4–10.8)

## 2018-12-18 ENCOUNTER — Encounter: Payer: Self-pay | Admitting: Neurology

## 2018-12-18 DIAGNOSIS — G43009 Migraine without aura, not intractable, without status migrainosus: Secondary | ICD-10-CM | POA: Insufficient documentation

## 2018-12-22 ENCOUNTER — Telehealth: Payer: Self-pay | Admitting: Neurology

## 2018-12-22 NOTE — Telephone Encounter (Signed)
Pt has called Raquel Sarna back, she is asking for a call back

## 2018-12-22 NOTE — Telephone Encounter (Signed)
LVM for pt to call back about scheduling mri  1st Dillon Bjork: [id number] 2nd [employer] Auth: [id number] (exp. 12/22/18 to 06/19/19)

## 2018-12-22 NOTE — Telephone Encounter (Signed)
LVM for patient to call back to schedule MRI.

## 2018-12-22 NOTE — Telephone Encounter (Signed)
Patient called canceling the mri due to financial reasoning's.

## 2018-12-22 NOTE — Telephone Encounter (Signed)
Patient returned my call she is scheduled for 12/27/18 at [facility].

## 2018-12-26 ENCOUNTER — Telehealth: Payer: Self-pay | Admitting: Neurology

## 2018-12-26 NOTE — Telephone Encounter (Signed)
Pt has called to inform that the recent medication prescribed for her head aches has not helped. Please call

## 2018-12-26 NOTE — Telephone Encounter (Signed)
I sent her an email, this is the best way to communicate with me thanks. If she does not email back I will let you know so you can call her and ask her to use email for any communications. thanks

## 2018-12-27 ENCOUNTER — Other Ambulatory Visit: Payer: BC Managed Care – PPO

## 2018-12-27 ENCOUNTER — Other Ambulatory Visit: Payer: Self-pay | Admitting: Neurology

## 2018-12-27 MED ORDER — PROCHLORPERAZINE MALEATE 10 MG PO TABS: 10.0000 mg | ORAL_TABLET | Freq: Four times a day (QID) | ORAL | 6 refills | Status: DC | PRN

## 2018-12-27 MED ORDER — NARATRIPTAN HCL 2.5 MG PO TABS: 2.5000 mg | ORAL_TABLET | ORAL | 6 refills | Status: DC | PRN

## 2019-02-20 NOTE — Telephone Encounter (Signed)
Called pt & LVM (ok per DPR) asking for a call back to schedule a phone visit with Dr. Jaynee Eagles. Offered tomorrow (Tues 7/14) at noon. I advised we do file this visit with insurance and would need her consent to do the telephone visit and to file with insurance. Left office number in message.

## 2019-02-20 NOTE — Telephone Encounter (Signed)
Noted thanks. Pt placed on schedule for tomorrow 12 pm.

## 2019-02-20 NOTE — Telephone Encounter (Signed)
Pt returned call and stated she would accept appt, pt also provided consent to file insurance for Telephone Visit

## 2019-02-21 ENCOUNTER — Telehealth: Payer: Self-pay | Admitting: *Deleted

## 2019-02-21 ENCOUNTER — Telehealth: Payer: Self-pay | Admitting: Neurology

## 2019-02-21 ENCOUNTER — Encounter: Payer: Self-pay | Admitting: *Deleted

## 2019-02-21 ENCOUNTER — Ambulatory Visit (INDEPENDENT_AMBULATORY_CARE_PROVIDER_SITE_OTHER): Payer: BC Managed Care – PPO | Admitting: Neurology

## 2019-02-21 DIAGNOSIS — G43009 Migraine without aura, not intractable, without status migrainosus: Secondary | ICD-10-CM

## 2019-02-21 MED ORDER — TOPIRAMATE 25 MG PO TABS: ORAL_TABLET | ORAL | 6 refills | Status: DC

## 2019-02-21 MED ORDER — METHYLPREDNISOLONE 4 MG PO TBPK: ORAL_TABLET | ORAL | 1 refills | Status: DC

## 2019-02-21 NOTE — Telephone Encounter (Signed)
I called patient and scheduled her for a 3 month follow-up for 05/29/19.

## 2019-02-21 NOTE — Telephone Encounter (Signed)
.  Catherine Clay, would you call patient and schedule her for a 3 month follow up with me please? Thank you

## 2019-02-21 NOTE — Progress Notes (Signed)
[facility]    Provider:  Dr Jaynee Eagles Requesting Provider: No ref. provider found Primary Care Provider:  Izora Gala, MD  CC:  Headache   Virtual Visit via Telephone Note  I connected with Catherine Clay on 02/21/19 at 12:00 PM EDT by telephone and verified that I am speaking with the correct person using two identifiers.  Location: Patient: home Provider: office   I discussed the limitations, risks, security and privacy concerns of performing an evaluation and management service by telephone and the availability of in person appointments. I also discussed with the patient that there may be a patient responsible charge related to this service. The patient expressed understanding and agreed to proceed.   I discussed the assessment and treatment plan with the patient. The patient was provided an opportunity to ask questions and all were answered. The patient agreed with the plan and demonstrated an understanding of the instructions.   The patient was advised to call back or seek an in-person evaluation if the symptoms worsen or if the condition fails to improve as anticipated.  I provided 15 minutes of non-face-to-face time during this encounter.   Catherine Beam, MD  Interval history 02/21/2019: her migraines are more frequent in the last few months since being seen. The acute medication works but she is having migraine 2-4 times a week.  No inciting event. Maybe mask use a lot. Also she has a new job and she can't eat at her job and drink often which also may be the cause, she is working a summer job at [employer] and she only gets one 15-min break over 6 hours. She gets a migraine when she works but also being in a car triggers the migraines moreso if she is a passenger but also when she is driving herself.   HPI:  Catherine Clay is a 33 y.o. female here as requested by No ref. provider found for migraines. Started about 3 years ago. Horrible headache in the temples,  pressure, nausea, motion sickness, nothing helped (ibuprofen, benadryl), + vomiting. Happened twice a year after it was the worse headache for a week, couldn't move, couldn't read, couldn't look at a screen, blurred vision. She went to the ED and CT was negative of the head. Happened 2-3x that year. This past year in 2019 happened about the same time in September and has been worsening and becoming more frequent it occurred over 3 months from Sept-December. Motion sickness is worse, she feels sick in the car with a headache. It is pounding, pulsating, throbbing, bilaterally. She can't ride in a car without some queasiness and lasts for 20 minutes may not be followed by a headache.  She has blurred vision. Headaches are worse with exertion, valsalva, bending over. No morning or nocturnal headaches. No Fhx of migraines. No other focal neurologic deficits, associated symptoms, inciting events or modifiable factors.  Reviewed notes, labs and imaging from outside physicians, which showed:  Reviewed emergency room notes.  Patient was seen in the emergency room for migraines and dizziness, temporal, also generalized fatigue and intermittent dizziness.  Started with feeling intermittently dizzy with a temporal frontal migraine that gradually increased in severity.  She describes her dizziness as feeling seasick.  She had one episode of vomiting.  She has a history of fluid behind her ears and vertigo.  At that denied she denied any history of migraines.  She denied any photophobia, vision changes, fever, chest pain, shortness of breath or any  other associated symptoms.  Ibuprofen and Tylenol did not help.  They noted a middle ear and fusion.  Also muscular tenderness in the neck.  Otherwise neurologic and physical exam were normal.  Her sodium was however 129.  Reviewed CT of the head report September 2018 which was normal except for mild mucosal thickening of the bilateral ethmoid sinuses.  Review of Systems:  Patient complains of symptoms per HPI as well as the following symptoms: headaches. Pertinent negatives and positives per HPI. All others negative.   Social History   Socioeconomic History  . Marital status: Married    Spouse name: Not on file  . Number of children: 0  . Years of education: Not on file  . Highest education level: Bachelor's degree (e.g., BA, AB, BS)  Occupational History  . Not on file  Social Needs  . Financial resource strain: Not on file  . Food insecurity    Worry: Not on file    Inability: Not on file  . Transportation needs    Medical: Not on file    Non-medical: Not on file  Tobacco Use  . Smoking status: Never Smoker  . Smokeless tobacco: Never Used  Substance and Sexual Activity  . Alcohol use: No  . Drug use: No  . Sexual activity: Yes    Partners: Male    Birth control/protection: Pill  Lifestyle  . Physical activity    Days per week: Not on file    Minutes per session: Not on file  . Stress: Not on file  Relationships  . Social Herbalist on phone: Not on file    Gets together: Not on file    Attends religious service: Not on file    Active member of club or organization: Not on file    Attends meetings of clubs or organizations: Not on file    Relationship status: Not on file  . Intimate partner violence    Fear of current or ex partner: Not on file    Emotionally abused: Not on file    Physically abused: Not on file    Forced sexual activity: Not on file  Other Topics Concern  . Not on file  Social History Narrative   Lives at home with her husband   Currently in the process of buying a home   Right handed    Caffeine: 2 cups of coffee daily     Family History  Problem Relation Age of Onset  . Cervical cancer Mother   . Hypertension Father   . Thyroid disease Father        hypothyroid  . Thyroid disease Sister        hypothyroid  . Thyroid disease Maternal Aunt        hypothyroid  . Skin cancer Maternal  Grandmother   . Colon cancer Paternal Grandfather   . Migraines Neg Hx     Past Medical History:  Diagnosis Date  . Broken finger    left pinky  . IBS (irritable bowel syndrome)   . Neuroma   . Osteoporosis   . Thyroid disease     Patient Active Problem List   Diagnosis Date Noted  . Migraine without aura and without status migrainosus, not intractable 12/18/2018  . Lichen sclerosus of female genitalia 08/09/2018  . Encounter for oral contraception initial prescription 08/09/2018  . Fatigue 02/26/2011  . SHIN SPLINTS 12/30/2009  . Pain in limb 09/30/2009  . ITBS, RIGHT KNEE 10/24/2008  .  BUNIONS, BILATERAL 07/27/2008  . STRESS FRACTURE, TIBIA 03/20/2008  . STRESS FRACTURE OF THE METATARSALS 03/20/2008  . UNEQUAL LEG LENGTH 03/20/2008  . SCOLIOSIS, LUMBAR SPINE 03/20/2008    Past Surgical History:  Procedure Laterality Date  . COLONOSCOPY    . FINGER FRACTURE SURGERY Right    5th finger  . WISDOM TOOTH EXTRACTION      Current Outpatient Medications  Medication Sig Dispense Refill  . B Complex Vitamins (B COMPLEX PO) Take by mouth.    Marland Kitchen CALCIUM PO Take by mouth.    . clobetasol ointment (TEMOVATE) 0.05 % Apply pea size amount to vulva BID x 4 weeks, then daily for 2 weeks. 60 g 0  . desogestrel-ethinyl estradiol (KARIVA,AZURETTE,MIRCETTE) 0.15-0.02/0.01 MG (21/5) tablet Take 1 tablet by mouth daily. 3 Package 4  . fexofenadine (ALLEGRA) 180 MG tablet Take 180 mg by mouth daily.    . fluticasone (FLONASE) 50 MCG/ACT nasal spray Place into both nostrils daily.    Marland Kitchen levothyroxine (SYNTHROID) 100 MCG tablet Take 100 mcg by mouth daily before breakfast.    . Multiple Vitamins-Minerals (MULTIVITAMIN WITH MINERALS) tablet Take 1 tablet by mouth daily.      . naratriptan (AMERGE) 2.5 MG tablet Take 1 tablet (2.5 mg total) by mouth as needed for migraine. Take one (1) tablet at onset of headache; may repeat after 2 hours; do not exceed five (5) mg in 24 hours. May take with  Compazine (chloproperazine). 10 tablet 6  . ondansetron (ZOFRAN-ODT) 4 MG disintegrating tablet Take 1 tablet (4 mg total) by mouth every 8 (eight) hours as needed for nausea. May take with Rizatriptan. (Patient not taking: Reported on 02/21/2019) 30 tablet 3  . Polyethylene Glycol 3350 (MIRALAX PO) Take by mouth.    . prochlorperazine (COMPAZINE) 10 MG tablet Take 1 tablet (10 mg total) by mouth every 6 (six) hours as needed for nausea or vomiting. 30 tablet 6  . rizatriptan (MAXALT-MLT) 10 MG disintegrating tablet Take 1 tablet (10 mg total) by mouth as needed for migraine. May repeat in 2 hours if needed. Take right at onset of migraine. Max 2x a day. May take with ondansetron for nausea. (Patient not taking: Reported on 02/21/2019) 9 tablet 11   No current facility-administered medications for this visit.     Allergies as of 02/21/2019 - Review Complete 02/21/2019  Allergen Reaction Noted  . Ceclor [cefaclor] Hives 02/26/2011    Vitals: There were no vitals taken for this visit. Last Weight:  Wt Readings from Last 1 Encounters:  12/12/18 116 lb (52.6 kg)   Last Height:   Ht Readings from Last 1 Encounters:  12/12/18 5\' 4"  (1.626 m)     Physical exam: Exam:    Physical exam: Exam: Gen: NAD, conversant      CV: attempted, Could not perform over Web Video. Denies palpitations or chest pain or SOB. VS: Breathing at a normal rate. Weight appears within normal limits. Not febrile. Eyes: Conjunctivae clear without exudates or hemorrhage  Neuro: Detailed Neurologic Exam  Speech:    Speech is normal; fluent and spontaneous with normal comprehension.  Cognition:    The patient is oriented to person, place, and time;     recent and remote memory intact;     language fluent;     normal attention, concentration,     fund of knowledge Cranial Nerves:    The pupils are equal, round, and reactive to light. Attempted, Cannot perform fundoscopic exam. Visual fields are full to  finger confrontation. Extraocular movements are intact.  The face is symmetric with normal sensation. The palate elevates in the midline. Hearing intact. Voice is normal. Shoulder shrug is normal. The tongue has normal motion without fasciculations.   Coordination:    Normal finger to nose  Gait:    Normal native gait  Motor Observation:   no involuntary movements noted. Tone:    Appears normal  Posture:    Posture is normal. normal erect    Strength:    Strength is anti-gravity and symmetric in the upper and lower limbs.      Sensation: intact to LT     Reflex Exam:  DTR's:    Attempted, Could not perform over Web Video   Toes: Attempted Could not perform over Web Video  Clonus:   Attempted, Could not perform over Web Video     Assessment/Plan: This is a 33 year old patient with likely migraines.  However given some concerning symptoms she needs a thorough evaluation including MRI of the brain.  - MRI brain due to concerning symptoms of worsening headaches (in frequency, severity with new quality), exertional, headaches, positional headaches,vision changes  to look for space occupying mass, chiari or intracranial hypertension (pseudotumor) or other intracranial etiology. Pending.  - Start topiramate for migraine prevention. Continue naratriptan and compazine for acute management. Do not get pregnant, discussed teratogenicity  - f/u 3 months  - Labs: she sees an endocrinologist and he follows her thyroid.   - I emailed patient an overview of migraines and migraine managment  Discussed: To prevent or relieve headaches, try the following: Cool Compress. Lie down and place a cool compress on your head.  Avoid headache triggers. If certain foods or odors seem to have triggered your migraines in the past, avoid them. A headache diary might help you identify triggers.  Include physical activity in your daily routine. Try a daily walk or other moderate aerobic exercise.   Manage stress. Find healthy ways to cope with the stressors, such as delegating tasks on your to-do list.  Practice relaxation techniques. Try deep breathing, yoga, massage and visualization.  Eat regularly. Eating regularly scheduled meals and maintaining a healthy diet might help prevent headaches. Also, drink plenty of fluids.  Follow a regular sleep schedule. Sleep deprivation might contribute to headaches Consider biofeedback. With this mind-body technique, you learn to control certain bodily functions - such as muscle tension, heart rate and blood pressure - to prevent headaches or reduce headache pain.    Proceed to emergency room if you experience new or worsening symptoms or symptoms do not resolve, if you have new neurologic symptoms or if headache is severe, or for any concerning symptom.   Provided education and documentation from American headache Society toolbox including articles on: chronic migraine medication overuse headache, chronic migraines, prevention of migraines, behavioral and other nonpharmacologic treatments for headache.    Follow Up Instructions:    I discussed the assessment and treatment plan with the patient. The patient was provided an opportunity to ask questions and all were answered. The patient agreed with the plan and demonstrated an understanding of the instructions.   The patient was advised to call back or seek an in-person evaluation if the symptoms worsen or if the condition fails to improve as anticipated.  A total of 80 minutes was spent in the care of this patient, facilitated by Video face-to-face(not in person) with this patient. Over half this time was spent on counseling patient on the  No diagnosis  found. diagnosis and different diagnostic and therapeutic options, counseling and coordination of care, risks ans benefits of management, compliance, or risk factor reduction and education.     No orders of the defined types were placed in this  encounter.  No orders of the defined types were placed in this encounter.   Cc: No ref. provider found,  Patient, No Pcp Per  Sarina Ill, MD  [facility] [address] [place], [place] [address]  Phone [phone] Fax [phone]

## 2019-02-21 NOTE — Telephone Encounter (Signed)
Spoke with pt and confirmed using 2 identifiers. Chart updated for 12 pm telephone visit today. Meds updated, no changes to PMH. Pt is available to be called earlier if needed.

## 2019-05-29 ENCOUNTER — Ambulatory Visit (INDEPENDENT_AMBULATORY_CARE_PROVIDER_SITE_OTHER): Payer: BC Managed Care – PPO | Admitting: Neurology

## 2019-05-29 ENCOUNTER — Encounter: Payer: Self-pay | Admitting: Neurology

## 2019-05-29 ENCOUNTER — Other Ambulatory Visit: Payer: Self-pay

## 2019-05-29 VITALS — BP 117/74 | HR 71 | Temp 98.0°F | Ht 64.0 in | Wt 116.0 lb

## 2019-05-29 DIAGNOSIS — G43009 Migraine without aura, not intractable, without status migrainosus: Secondary | ICD-10-CM | POA: Diagnosis not present

## 2019-05-29 MED ORDER — TOPIRAMATE 50 MG PO TABS: 50.0000 mg | ORAL_TABLET | Freq: Every day | ORAL | 3 refills | Status: DC

## 2019-05-29 MED ORDER — NARATRIPTAN HCL 2.5 MG PO TABS: 2.5000 mg | ORAL_TABLET | ORAL | 6 refills | Status: DC | PRN

## 2019-05-29 MED ORDER — PROCHLORPERAZINE MALEATE 10 MG PO TABS: 10.0000 mg | ORAL_TABLET | Freq: Four times a day (QID) | ORAL | 6 refills | Status: DC | PRN

## 2019-05-29 NOTE — Progress Notes (Addendum)
[facility]    Provider:  Dr Jaynee Eagles Requesting Provider: No ref. provider found Primary Care Provider:  Izora Gala, MD  CC:  Headache   Interval history 05/29/2019: She is doing well. She is on Topamax 25mg , increase to 50mg . We discussed side effects. She is a Education officer, museum. Worried about the side effects. Email me if side effects and we can try something different. MRI was too expensive.   Interval history 02/21/2019: her migraines are more frequent in the last few months since being seen. The acute medication works but she is having migraine 2-4 times a week.  No inciting event. Maybe mask use a lot. Also she has a new job and she can't eat at her job and drink often which also may be the cause, she is working a summer job at [employer] and she only gets one 15-min break over 6 hours. She gets a migraine when she works but also being in a car triggers the migraines moreso if she is a passenger but also when she is driving herself.   HPI:  Catherine Clay is a 33 y.o. female here as requested by No ref. provider found for migraines. Started about 3 years ago. Horrible headache in the temples, pressure, nausea, motion sickness, nothing helped (ibuprofen, benadryl), + vomiting. Happened twice a year after it was the worse headache for a week, couldn't move, couldn't read, couldn't look at a screen, blurred vision. She went to the ED and CT was negative of the head. Happened 2-3x that year. This past year in 2019 happened about the same time in September and has been worsening and becoming more frequent it occurred over 3 months from Sept-December. Motion sickness is worse, she feels sick in the car with a headache. It is pounding, pulsating, throbbing, bilaterally. She can't ride in a car without some queasiness and lasts for 20 minutes may not be followed by a headache.  She has blurred vision. Headaches are worse with exertion, valsalva, bending over. No morning or nocturnal  headaches. No Fhx of migraines. No other focal neurologic deficits, associated symptoms, inciting events or modifiable factors.  Reviewed notes, labs and imaging from outside physicians, which showed:  Reviewed emergency room notes.  Patient was seen in the emergency room for migraines and dizziness, temporal, also generalized fatigue and intermittent dizziness.  Started with feeling intermittently dizzy with a temporal frontal migraine that gradually increased in severity.  She describes her dizziness as feeling seasick.  She had one episode of vomiting.  She has a history of fluid behind her ears and vertigo.  At that denied she denied any history of migraines.  She denied any photophobia, vision changes, fever, chest pain, shortness of breath or any other associated symptoms.  Ibuprofen and Tylenol did not help.  They noted a middle ear and fusion.  Also muscular tenderness in the neck.  Otherwise neurologic and physical exam were normal.  Her sodium was however 129.  Reviewed CT of the head report September 2018 which was normal except for mild mucosal thickening of the bilateral ethmoid sinuses.  Review of Systems: Patient complains of symptoms per HPI as well as the following symptoms: headaches, dizziness. Pertinent negatives and positives per HPI. All others negative.   Social History   Socioeconomic History  . Marital status: Married    Spouse name: Not on file  . Number of children: 0  . Years of education: Not on file  . Highest education level: Bachelor's  degree (e.g., BA, AB, BS)  Occupational History  . Not on file  Social Needs  . Financial resource strain: Not on file  . Food insecurity    Worry: Not on file    Inability: Not on file  . Transportation needs    Medical: Not on file    Non-medical: Not on file  Tobacco Use  . Smoking status: Never Smoker  . Smokeless tobacco: Never Used  Substance and Sexual Activity  . Alcohol use: No  . Drug use: No  . Sexual  activity: Yes    Partners: Male    Birth control/protection: Pill  Lifestyle  . Physical activity    Days per week: Not on file    Minutes per session: Not on file  . Stress: Not on file  Relationships  . Social Herbalist on phone: Not on file    Gets together: Not on file    Attends religious service: Not on file    Active member of club or organization: Not on file    Attends meetings of clubs or organizations: Not on file    Relationship status: Not on file  . Intimate partner violence    Fear of current or ex partner: Not on file    Emotionally abused: Not on file    Physically abused: Not on file    Forced sexual activity: Not on file  Other Topics Concern  . Not on file  Social History Narrative   Lives at home with her husband   Recently moved into a new home   Right handed    Caffeine: 2 cups of coffee daily     Family History  Problem Relation Age of Onset  . Cervical cancer Mother   . Hypertension Father   . Thyroid disease Father        hypothyroid  . Thyroid disease Sister        hypothyroid  . Thyroid disease Maternal Aunt        hypothyroid  . Skin cancer Maternal Grandmother   . Colon cancer Paternal Grandfather   . Migraines Neg Hx     Past Medical History:  Diagnosis Date  . Broken finger    left pinky  . IBS (irritable bowel syndrome)   . Neuroma   . Osteoporosis   . Thyroid disease     Patient Active Problem List   Diagnosis Date Noted  . Migraine without aura and without status migrainosus, not intractable 12/18/2018  . Lichen sclerosus of female genitalia 08/09/2018  . Encounter for oral contraception initial prescription 08/09/2018  . Fatigue 02/26/2011  . SHIN SPLINTS 12/30/2009  . Pain in limb 09/30/2009  . ITBS, RIGHT KNEE 10/24/2008  . BUNIONS, BILATERAL 07/27/2008  . STRESS FRACTURE, TIBIA 03/20/2008  . STRESS FRACTURE OF THE METATARSALS 03/20/2008  . UNEQUAL LEG LENGTH 03/20/2008  . SCOLIOSIS, LUMBAR SPINE  03/20/2008    Past Surgical History:  Procedure Laterality Date  . COLONOSCOPY    . FINGER FRACTURE SURGERY Right    5th finger  . WISDOM TOOTH EXTRACTION      Current Outpatient Medications  Medication Sig Dispense Refill  . B Complex Vitamins (B COMPLEX PO) Take by mouth.    Marland Kitchen CALCIUM PO Take by mouth.    . clobetasol ointment (TEMOVATE) 0.05 % Apply pea size amount to vulva BID x 4 weeks, then daily for 2 weeks. 60 g 0  . desogestrel-ethinyl estradiol (KARIVA,AZURETTE,MIRCETTE) 0.15-0.02/0.01 MG (21/5) tablet  Take 1 tablet by mouth daily. 3 Package 4  . fexofenadine (ALLEGRA) 180 MG tablet Take 180 mg by mouth daily.    . fluticasone (FLONASE) 50 MCG/ACT nasal spray Place into both nostrils daily.    Marland Kitchen levothyroxine (SYNTHROID) 100 MCG tablet Take 100 mcg by mouth daily before breakfast.    . methylPREDNISolone (MEDROL DOSEPAK) 4 MG TBPK tablet Take pills together with food every morning for 6 days 21 tablet 1  . Multiple Vitamins-Minerals (MULTIVITAMIN WITH MINERALS) tablet Take 1 tablet by mouth daily.      . naratriptan (AMERGE) 2.5 MG tablet Take 1 tablet (2.5 mg total) by mouth as needed for migraine. Take one (1) tablet at onset of headache; may repeat after 2 hours; do not exceed five (5) mg in 24 hours. May take with Compazine (chloproperazine). 10 tablet 6  . Polyethylene Glycol 3350 (MIRALAX PO) Take by mouth.    . prochlorperazine (COMPAZINE) 10 MG tablet Take 1 tablet (10 mg total) by mouth every 6 (six) hours as needed for nausea or vomiting. 30 tablet 6  . topiramate (TOPAMAX) 50 MG tablet Take 1 tablet (50 mg total) by mouth at bedtime. 90 tablet 3   No current facility-administered medications for this visit.     Allergies as of 05/29/2019 - Review Complete 05/29/2019  Allergen Reaction Noted  . Ceclor [cefaclor] Hives 02/26/2011    Vitals: BP 117/74 (BP Location: Right Arm, Patient Position: Sitting)   Pulse 71   Temp 98 F (36.7 C) Comment: taken at front  door  Ht 5\' 4"  (1.626 m)   Wt 116 lb (52.6 kg)   BMI 19.91 kg/m  Last Weight:  Wt Readings from Last 1 Encounters:  05/29/19 116 lb (52.6 kg)   Last Height:   Ht Readings from Last 1 Encounters:  05/29/19 5\' 4"  (1.626 m)     Physical exam: Exam:    Physical exam: Exam: Gen: NAD, conversant      CV: attempted, Could not perform over Web Video. Denies palpitations or chest pain or SOB. VS: Breathing at a normal rate. Weight appears within normal limits. Not febrile. Eyes: Conjunctivae clear without exudates or hemorrhage  Neuro: Detailed Neurologic Exam  Speech:    Speech is normal; fluent and spontaneous with normal comprehension.  Cognition:    The patient is oriented to person, place, and time;     recent and remote memory intact;     language fluent;     normal attention, concentration,     fund of knowledge Cranial Nerves:    The pupils are equal, round, and reactive to light. Attempted, Cannot perform fundoscopic exam. Visual fields are full to finger confrontation. Extraocular movements are intact.  The face is symmetric with normal sensation. The palate elevates in the midline. Hearing intact. Voice is normal. Shoulder shrug is normal. The tongue has normal motion without fasciculations.   Coordination:    Normal finger to nose  Gait:    Normal native gait  Motor Observation:   no involuntary movements noted. Tone:    Appears normal  Posture:    Posture is normal. normal erect    Strength:    Strength is anti-gravity and symmetric in the upper and lower limbs.      Sensation: intact to LT     Reflex Exam:  DTR's:    Attempted, Could not perform over Web Video   Toes: Attempted Could not perform over Web Video  Clonus:   Attempted, Could  not perform over Web Video     Assessment/Plan: This is a 33 year old patient with likely migraines.  However given some concerning symptoms she needs a thorough evaluation including MRI of the brain.  -  MRI brain due to concerning symptoms of worsening headaches (in frequency, severity with new quality), exertional, headaches, positional headaches,vision changes  to look for space occupying mass, chiari or intracranial hypertension (pseudotumor) or other intracranial etiology. Pending.  - . Continue naratriptan and compazine for acute management. Do not get pregnant, discussed teratogenicity. Doing well.  - f/u 1 year unless sooner needed  - Labs: she sees an endocrinologist and he follows her thyroid.   - I emailed patient an overview of migraines and migraine management. Today gave info on vestibular symptoms in migraines.  Meds ordered this encounter  Medications  . topiramate (TOPAMAX) 50 MG tablet    Sig: Take 1 tablet (50 mg total) by mouth at bedtime.    Dispense:  90 tablet    Refill:  3  . naratriptan (AMERGE) 2.5 MG tablet    Sig: Take 1 tablet (2.5 mg total) by mouth as needed for migraine. Take one (1) tablet at onset of headache; may repeat after 2 hours; do not exceed five (5) mg in 24 hours. May take with Compazine (chloproperazine).    Dispense:  10 tablet    Refill:  6  . prochlorperazine (COMPAZINE) 10 MG tablet    Sig: Take 1 tablet (10 mg total) by mouth every 6 (six) hours as needed for nausea or vomiting.    Dispense:  30 tablet    Refill:  6     Discussed: To prevent or relieve headaches, try the following: Cool Compress. Lie down and place a cool compress on your head.  Avoid headache triggers. If certain foods or odors seem to have triggered your migraines in the past, avoid them. A headache diary might help you identify triggers.  Include physical activity in your daily routine. Try a daily walk or other moderate aerobic exercise.  Manage stress. Find healthy ways to cope with the stressors, such as delegating tasks on your to-do list.  Practice relaxation techniques. Try deep breathing, yoga, massage and visualization.  Eat regularly. Eating regularly  scheduled meals and maintaining a healthy diet might help prevent headaches. Also, drink plenty of fluids.  Follow a regular sleep schedule. Sleep deprivation might contribute to headaches Consider biofeedback. With this mind-body technique, you learn to control certain bodily functions - such as muscle tension, heart rate and blood pressure - to prevent headaches or reduce headache pain.    Proceed to emergency room if you experience new or worsening symptoms or symptoms do not resolve, if you have new neurologic symptoms or if headache is severe, or for any concerning symptom.   Provided education and documentation from American headache Society toolbox including articles on: chronic migraine medication overuse headache, chronic migraines, prevention of migraines, behavioral and other nonpharmacologic treatments for headache.   A total of 15 minutes was spent face-to-face with this patient. Over half this time was spent on counseling patient on the  1. Migraine without aura and without status migrainosus, not intractable    diagnosis and different diagnostic and therapeutic options, counseling and coordination of care, risks ans benefits of management, compliance, or risk factor reduction and education.     Sarina Ill, MD  [facility] [address] [place], [place] [address]  Phone [phone] Fax [phone]

## 2019-05-31 DIAGNOSIS — E039 Hypothyroidism, unspecified: Secondary | ICD-10-CM | POA: Insufficient documentation

## 2019-05-31 DIAGNOSIS — G43909 Migraine, unspecified, not intractable, without status migrainosus: Secondary | ICD-10-CM | POA: Diagnosis not present

## 2019-06-06 DIAGNOSIS — Z008 Encounter for other general examination: Secondary | ICD-10-CM | POA: Diagnosis not present

## 2019-08-14 ENCOUNTER — Ambulatory Visit: Payer: BC Managed Care – PPO | Admitting: Certified Nurse Midwife

## 2019-08-16 ENCOUNTER — Other Ambulatory Visit: Payer: Self-pay | Admitting: Neurology

## 2019-09-22 ENCOUNTER — Ambulatory Visit: Payer: BC Managed Care – PPO | Admitting: Certified Nurse Midwife

## 2019-10-15 ENCOUNTER — Other Ambulatory Visit: Payer: Self-pay | Admitting: Certified Nurse Midwife

## 2019-10-15 DIAGNOSIS — G43009 Migraine without aura, not intractable, without status migrainosus: Secondary | ICD-10-CM

## 2019-10-15 DIAGNOSIS — Z30011 Encounter for initial prescription of contraceptive pills: Secondary | ICD-10-CM

## 2019-10-16 NOTE — Telephone Encounter (Signed)
Message left to return call to Gerhardt Gleed at [phone].    

## 2019-10-16 NOTE — Telephone Encounter (Signed)
Medication refill request: OCP Last AEX:  08-09-18 DL  Next AEX: 11-13-19  Last MMG (if hormonal medication request): n/a Refill authorized: Today, please advise.   Medication pended for #28, 0RF. Please refill if appropriate.

## 2019-10-16 NOTE — Telephone Encounter (Signed)
Patient returned call. Patient states that Dr. Jaynee Eagles put her on 50mg  og topiramate daily and naratriptan and prochlorperazine as needed when she feels a migraine "coming on." Patient states she feels her migraines are much more controlled once starting medication. RN advised would update Melvia Heaps, CNM and return call if any additional recommendations. Pharmacy confirmed as [facility] in [place].   Routing to provider for review.

## 2019-10-16 NOTE — Telephone Encounter (Signed)
She needs a phone call regarding her migraines, which per note in chart from Dr. Jaynee Eagles have gotten worse. I am not comfortable with renewing without knowing if she is still having issues with this.

## 2019-10-17 MED ORDER — NORETHINDRONE 0.35 MG PO TABS
1.0000 | ORAL_TABLET | Freq: Every day | ORAL | 0 refills | Status: DC
Start: 2019-10-17 — End: 2019-11-13

## 2019-10-17 NOTE — Telephone Encounter (Signed)
Spoke to pt. Pt given update and recommendations per Debbi. CNM. Pt agreeable to try Rx Micronor. Rx sent to pharmacy on file # 3 packs, 0RF. Pt aware to use everyday and no skipping pills as before on combo pack. Pt also has concerns with taking pill continuously and not having "bleeding for a cycle" . Pt assured that was ok for now. Pt advised to be safe with having migraines and taking POPs vs OCPs. Pt agreeable. Pt to call back in 3 months of taking new Rx with update. Pt verbalized understanding.   Routing to Debbi, CNM with any additional recommendations.

## 2019-10-17 NOTE — Telephone Encounter (Signed)
Left message for pt to call back to Seleste Tallman, RN in triage. 

## 2019-10-17 NOTE — Telephone Encounter (Signed)
Agree with plan 

## 2019-10-17 NOTE — Telephone Encounter (Signed)
I reviewed the medications she has been put on for her Migraines and feel due to this  she should be changed to progesterone only contraception, no combination as she takes now. This eliminates the risk of stroke with estrogen use and frequent migraines, even though she does not have history of aura.  I feel this is safer for her.  Micronor is taken every day and no off days. Period profile is no period, regular period or very light. If agreeable will Rx for her to start first day of next period.

## 2019-10-18 ENCOUNTER — Telehealth: Payer: Self-pay | Admitting: Certified Nurse Midwife

## 2019-10-18 NOTE — Telephone Encounter (Signed)
Patient is following up with her request to change her medication.

## 2019-10-18 NOTE — Telephone Encounter (Signed)
Pt called and asking questions about Micronor Rx. Pt given same instructions as before with starting pack with first bleeding in next cycle. Pt agreeable. Pt nervous to start new Rx but knows its important for her health with migraines.   Routing to D. Hollice Espy, CNM for review and will close encounter.

## 2019-10-19 DIAGNOSIS — Z20822 Contact with and (suspected) exposure to covid-19: Secondary | ICD-10-CM | POA: Diagnosis not present

## 2019-10-19 DIAGNOSIS — R0789 Other chest pain: Secondary | ICD-10-CM | POA: Diagnosis not present

## 2019-10-19 DIAGNOSIS — R0602 Shortness of breath: Secondary | ICD-10-CM | POA: Diagnosis not present

## 2019-10-23 ENCOUNTER — Encounter: Payer: Self-pay | Admitting: Certified Nurse Midwife

## 2019-10-25 ENCOUNTER — Encounter: Payer: Self-pay | Admitting: Certified Nurse Midwife

## 2019-11-13 ENCOUNTER — Encounter: Payer: Self-pay | Admitting: Obstetrics and Gynecology

## 2019-11-13 ENCOUNTER — Ambulatory Visit: Payer: Self-pay | Admitting: Certified Nurse Midwife

## 2019-11-13 ENCOUNTER — Ambulatory Visit (INDEPENDENT_AMBULATORY_CARE_PROVIDER_SITE_OTHER): Payer: BC Managed Care – PPO | Admitting: Obstetrics and Gynecology

## 2019-11-13 ENCOUNTER — Other Ambulatory Visit: Payer: Self-pay

## 2019-11-13 VITALS — BP 112/58 | HR 72 | Temp 98.4°F | Ht 63.5 in | Wt 114.0 lb

## 2019-11-13 DIAGNOSIS — Z01419 Encounter for gynecological examination (general) (routine) without abnormal findings: Secondary | ICD-10-CM

## 2019-11-13 DIAGNOSIS — L900 Lichen sclerosus et atrophicus: Secondary | ICD-10-CM

## 2019-11-13 DIAGNOSIS — M25551 Pain in right hip: Secondary | ICD-10-CM | POA: Diagnosis not present

## 2019-11-13 NOTE — Progress Notes (Signed)
34 y.o. G0P0000 Married White or Caucasian Not Hispanic or Latino female here for annual exam.  Ms Hollice Espy changed her from OCP's to POP's recently a month ago. She has may have had a slight improvement in her headaches for the last month.  She went on the pill in high school for oligomenorrhea and very heavy cycle.  No current plans for pregnancy in the near future. Some entry dyspareunia, help with lubrication.  Period Cycle (Days): 28 Period Duration (Days): 4 Period Pattern: Regular Menstrual Flow: Moderate, Light Menstrual Control: Thin pad, Tampon Menstrual Control Change Freq (Hours): 4 Dysmenorrhea: (!) Severe Dysmenorrhea Symptoms: Cramping  Cramps vary, can be severe.   H/O lichen sclerosis.  Currently okay. She uses clobetasol intermittent with a flare for 2 weeks.   Patient's last menstrual period was 10/24/2019 (approximate).          Sexually active: Yes.    The current method of family planning is OCP (estrogen/progesterone).    Exercising: Yes.    Running/swimming  Smoker:  no  Health Maintenance: Pap:  08/09/18 Normal HPV Neg, 2017 per patient  History of abnormal Pap:  no TDaP:  2019 Gardasil: Completed    reports that she has never smoked. She has never used smokeless tobacco. She reports that she does not drink alcohol or use drugs. She is a high school Music therapist.   Past Medical History:  Diagnosis Date  . Broken finger    left pinky  . IBS (irritable bowel syndrome)   . Migraine without aura   . Neuroma   . Thyroid disease     Past Surgical History:  Procedure Laterality Date  . COLONOSCOPY    . FINGER FRACTURE SURGERY Right    5th finger  . WISDOM TOOTH EXTRACTION      Current Outpatient Medications  Medication Sig Dispense Refill  . clobetasol ointment (TEMOVATE) 0.05 % Apply pea size amount to vulva BID x 4 weeks, then daily for 2 weeks. 60 g 0  . fexofenadine (ALLEGRA) 180 MG tablet Take 180 mg by mouth daily.    . fluticasone (FLONASE)  50 MCG/ACT nasal spray Place into both nostrils daily.    Marland Kitchen levothyroxine (SYNTHROID) 100 MCG tablet Take 100 mcg by mouth daily before breakfast.    . Multiple Vitamins-Minerals (MULTIVITAMIN WITH MINERALS) tablet Take 1 tablet by mouth daily.      . naratriptan (AMERGE) 2.5 MG tablet Take 1 tablet (2.5 mg total) by mouth as needed for migraine. Take one (1) tablet at onset of headache; may repeat after 2 hours; do not exceed five (5) mg in 24 hours. May take with Compazine (chloproperazine). 10 tablet 6  . Polyethylene Glycol 3350 (MIRALAX PO) Take by mouth.    . prochlorperazine (COMPAZINE) 10 MG tablet Take 1 tablet (10 mg total) by mouth every 6 (six) hours as needed for nausea or vomiting. 30 tablet 6  . topiramate (TOPAMAX) 50 MG tablet Take 1 tablet (50 mg total) by mouth at bedtime. 90 tablet 3   No current facility-administered medications for this visit.    Family History  Problem Relation Age of Onset  . Cervical cancer Mother   . Hypertension Father   . Thyroid disease Father        hypothyroid  . Thyroid disease Sister        hypothyroid  . Thyroid disease Maternal Aunt        hypothyroid  . Skin cancer Maternal Grandmother   . Colon cancer  Paternal Grandfather   . Migraines Neg Hx     Review of Systems  All other systems reviewed and are negative.   Exam:   BP (!) 112/58   Pulse 72   Temp 98.4 F (36.9 C)   Ht 5' 3.5" (1.613 m)   Wt 114 lb (51.7 kg)   LMP 10/24/2019 (Approximate)   SpO2 98%   BMI 19.88 kg/m   Weight change: @WEIGHTCHANGE @ Height:   Height: 5' 3.5" (161.3 cm)  Ht Readings from Last 3 Encounters:  11/13/19 5' 3.5" (1.613 m)  05/29/19 5\' 4"  (1.626 m)  12/12/18 5\' 4"  (1.626 m)    General appearance: alert, cooperative and appears stated age Head: Normocephalic, without obvious abnormality, atraumatic Neck: no adenopathy, supple, symmetrical, trachea midline and thyroid normal to inspection and palpation Lungs: clear to auscultation  bilaterally Cardiovascular: regular rate and rhythm Breasts: normal appearance, no masses or tenderness Abdomen: soft, non-tender; non distended,  no masses,  no organomegaly Extremities: extremities normal, atraumatic, no cyanosis or edema Skin: Skin color, texture, turgor normal. No rashes or lesions Lymph nodes: Cervical, supraclavicular, and axillary nodes normal. No abnormal inguinal nodes palpated Neurologic: Grossly normal   Pelvic: External genitalia:  no lesions              Urethra:  normal appearing urethra with no masses, tenderness or lesions              Bartholins and Skenes: normal                 Vagina: normal appearing vagina with normal color and discharge, no lesions              Cervix: no lesions and stenotic               Bimanual Exam:  Uterus:  normal size, contour, position, consistency, mobility, non-tender and mid position              Adnexa: no mass, fullness, tenderness               Rectovaginal: Confirms               Anus:  normal sphincter tone, no lesions  Gae Dry chaperoned for the exam.  A:  Well Woman with normal exam  Contraception, was on OCP's, changed to POP last month (by Evalee Mutton, CNM)  Migraines without aura, on topamax  Stenotic appearing cervix  P:   No pap this year  Information on mirena IUD given. Will check with Dr Jaynee Eagles about OCP's then be in touch with the patient. Not sure why the patient was changed to the minipill.   If she decides on a mirena will pretreat with cytotec  Discussed that topamax can decrease the effectiveness or oral contraception, discussed use of condoms  Discussed breast self exam  Discussed calcium and vit D intake  Labs UTD with insurance testing and Dr Jaynee Eagles

## 2019-11-13 NOTE — Patient Instructions (Signed)
EXERCISE AND DIET:  We recommended that you start or continue a regular exercise program for good health. Regular exercise means any activity that makes your heart beat faster and makes you sweat.  We recommend exercising at least 30 minutes per day at least 3 days a week, preferably 4 or 5.  We also recommend a diet low in fat and sugar.  Inactivity, poor dietary choices and obesity can cause diabetes, heart attack, stroke, and kidney damage, among others.    ALCOHOL AND SMOKING:  Women should limit their alcohol intake to no more than 7 drinks/beers/glasses of wine (combined, not each!) per week. Moderation of alcohol intake to this level decreases your risk of breast cancer and liver damage. And of course, no recreational drugs are part of a healthy lifestyle.  And absolutely no smoking or even second hand smoke. Most people know smoking can cause heart and lung diseases, but did you know it also contributes to weakening of your bones? Aging of your skin?  Yellowing of your teeth and nails?  CALCIUM AND VITAMIN D:  Adequate intake of calcium and Vitamin D are recommended.  The recommendations for exact amounts of these supplements seem to change often, but generally speaking 1,000 mg of calcium (between diet and supplement) and 800 units of Vitamin D per day seems prudent. Certain women may benefit from higher intake of Vitamin D.  If you are among these women, your doctor will have told you during your visit.    PAP SMEARS:  Pap smears, to check for cervical cancer or precancers,  have traditionally been done yearly, although recent scientific advances have shown that most women can have pap smears less often.  However, every woman still should have a physical exam from her gynecologist every year. It will include a breast check, inspection of the vulva and vagina to check for abnormal growths or skin changes, a visual exam of the cervix, and then an exam to evaluate the size and shape of the uterus and  ovaries.  And after 32 years of age, a rectal exam is indicated to check for rectal cancers. We will also provide age appropriate advice regarding health maintenance, like when you should have certain vaccines, screening for sexually transmitted diseases, bone density testing, colonoscopy, mammograms, etc.   MAMMOGRAMS:  All women over 40 years old should have a yearly mammogram. Many facilities now offer a "3D" mammogram, which may cost around $50 extra out of pocket. If possible,  we recommend you accept the option to have the 3D mammogram performed.  It both reduces the number of women who will be called back for extra views which then turn out to be normal, and it is better than the routine mammogram at detecting truly abnormal areas.    COLON CANCER SCREENING: Now recommend starting at age 45. At this time colonoscopy is not covered for routine screening until 50. There are take home tests that can be done between 45-49.   COLONOSCOPY:  Colonoscopy to screen for colon cancer is recommended for all women at age 50.  We know, you hate the idea of the prep.  We agree, BUT, having colon cancer and not knowing it is worse!!  Colon cancer so often starts as a polyp that can be seen and removed at colonscopy, which can quite literally save your life!  And if your first colonoscopy is normal and you have no family history of colon cancer, most women don't have to have it again for   10 years.  Once every ten years, you can do something that may end up saving your life, right?  We will be happy to help you get it scheduled when you are ready.  Be sure to check your insurance coverage so you understand how much it will cost.  It may be covered as a preventative service at no cost, but you should check your particular policy.      Breast Self-Awareness Breast self-awareness means being familiar with how your breasts look and feel. It involves checking your breasts regularly and reporting any changes to your  health care provider. Practicing breast self-awareness is important. A change in your breasts can be a sign of a serious medical problem. Being familiar with how your breasts look and feel allows you to find any problems early, when treatment is more likely to be successful. All women should practice breast self-awareness, including women who have had breast implants. How to do a breast self-exam One way to learn what is normal for your breasts and whether your breasts are changing is to do a breast self-exam. To do a breast self-exam: Look for Changes  1. Remove all the clothing above your waist. 2. Stand in front of a mirror in a room with good lighting. 3. Put your hands on your hips. 4. Push your hands firmly downward. 5. Compare your breasts in the mirror. Look for differences between them (asymmetry), such as: ? Differences in shape. ? Differences in size. ? Puckers, dips, and bumps in one breast and not the other. 6. Look at each breast for changes in your skin, such as: ? Redness. ? Scaly areas. 7. Look for changes in your nipples, such as: ? Discharge. ? Bleeding. ? Dimpling. ? Redness. ? A change in position. Feel for Changes Carefully feel your breasts for lumps and changes. It is best to do this while lying on your back on the floor and again while sitting or standing in the shower or tub with soapy water on your skin. Feel each breast in the following way:  Place the arm on the side of the breast you are examining above your head.  Feel your breast with the other hand.  Start in the nipple area and make  inch (2 cm) overlapping circles to feel your breast. Use the pads of your three middle fingers to do this. Apply light pressure, then medium pressure, then firm pressure. The light pressure will allow you to feel the tissue closest to the skin. The medium pressure will allow you to feel the tissue that is a little deeper. The firm pressure will allow you to feel the tissue  close to the ribs.  Continue the overlapping circles, moving downward over the breast until you feel your ribs below your breast.  Move one finger-width toward the center of the body. Continue to use the  inch (2 cm) overlapping circles to feel your breast as you move slowly up toward your collarbone.  Continue the up and down exam using all three pressures until you reach your armpit.  Write Down What You Find  Write down what is normal for each breast and any changes that you find. Keep a written record with breast changes or normal findings for each breast. By writing this information down, you do not need to depend only on memory for size, tenderness, or location. Write down where you are in your menstrual cycle, if you are still menstruating. If you are having trouble noticing differences   in your breasts, do not get discouraged. With time you will become more familiar with the variations in your breasts and more comfortable with the exam. How often should I examine my breasts? Examine your breasts every month. If you are breastfeeding, the best time to examine your breasts is after a feeding or after using a breast pump. If you menstruate, the best time to examine your breasts is 5-7 days after your period is over. During your period, your breasts are lumpier, and it may be more difficult to notice changes. When should I see my health care provider? See your health care provider if you notice:  A change in shape or size of your breasts or nipples.  A change in the skin of your breast or nipples, such as a reddened or scaly area.  Unusual discharge from your nipples.  A lump or thick area that was not there before.  Pain in your breasts.  Anything that concerns you.  

## 2019-11-22 ENCOUNTER — Encounter: Payer: Self-pay | Admitting: Obstetrics and Gynecology

## 2019-11-22 ENCOUNTER — Telehealth: Payer: Self-pay | Admitting: Obstetrics and Gynecology

## 2019-11-22 DIAGNOSIS — Z30430 Encounter for insertion of intrauterine contraceptive device: Secondary | ICD-10-CM

## 2019-11-22 MED ORDER — MISOPROSTOL 200 MCG PO TABS: ORAL_TABLET | ORAL | 0 refills | Status: DC

## 2019-11-22 NOTE — Telephone Encounter (Signed)
Dr Jaynee Eagles is fine with her being on combination OCP's in regards to her migraines (no auras).  I didn't ask her about the topamax, I can't find anything in the literature about dosing of topamax and effect on OCP's, just that topamax can decrease the effectiveness of OCP's. The world health organization states that the risk of using topamax with OCP's likely outweighs the benefit.  If she wants to continue on OCP's I would recommend condoms for back up. She can go back on combination OCP's if she wants with condoms or we can go ahead with the IUD

## 2019-11-22 NOTE — Telephone Encounter (Signed)
Patient was seen in office on 11/13/19.   Routing MyChart message to Dr. Talbert Nan to advise.

## 2019-11-22 NOTE — Telephone Encounter (Signed)
[facility]  Phone Number: [phone]  Hi Dr. Talbert Nan,  I was just following up from my appointment on the migraine medication affecting the birth control medication. I was not sure if you had heard back from Dr. Jaynee Eagles on whether or not the dosage of my migraine medication would impact the effectiveness of my birth control medication. Based on this information, I can make my decision on whether or not I would like to go forward with getting the IUD.   Thanks,  [employer]

## 2019-11-22 NOTE — Telephone Encounter (Signed)
Left message to call Deshaun Schou, RN at [facility] [phone].   

## 2019-11-22 NOTE — Telephone Encounter (Signed)
Spoke with patient, advised per Dr. Talbert Nan. Patient request to schedule Mirena IUD insertion. LMP 11/15/19. Currently on OCP. Patient request to schedule on a Friday.   IUD insertion scheduled for 4/23 at 9am with Dr. Talbert Nan.  Per review of 11/13/19 AEX notes, pretreat with cytotec. Rx sent to verified pharmacy for Cytotec 200 mcg  Pace 1 tablet vaginal the night before procedure. Place 1 tablet vaginal the morning of the procedure. Advised to take Motrin 800 mg with food and water one hour before procedure.Eat and hydrate well. Continue OCP until IUD inserted.   Order placed for IUD insertion for precert.  Patient verbalizes understanding and is agreeable.   Routing to provider for final review. Patient is agreeable to disposition. Will close encounter.  Cc: Wilson Singer, Magdalene Patricia

## 2019-11-22 NOTE — Telephone Encounter (Signed)
Patient returned a call to Jill.   

## 2019-11-23 ENCOUNTER — Telehealth: Payer: Self-pay | Admitting: Obstetrics and Gynecology

## 2019-11-23 NOTE — Telephone Encounter (Signed)
Call to patient, left detailed message, ok per dpr.  Per Dr. Talbert Nan,  Cytotec 200 mcg Place 2 tablets vaginally 6-12 hrs prior to procedure. Your procedure is scheduled for 9am, place both tablets vaginally the night before your procedure.  Return call to office if you have any additional questions.   Routing to Dr. Rosann Auerbach.

## 2019-11-23 NOTE — Telephone Encounter (Signed)
Patient returned my call and I conveyed the benefits. Patient understands/agreeable with the benefits. Patient is aware of the cancellation policy. Appointment scheduled 12/01/19.

## 2019-11-23 NOTE — Telephone Encounter (Signed)
Call placed to convey benefits for Mirena insertion. °

## 2019-11-24 ENCOUNTER — Telehealth: Payer: Self-pay

## 2019-11-24 NOTE — Telephone Encounter (Signed)
Opened in error

## 2019-11-27 ENCOUNTER — Telehealth: Payer: Self-pay | Admitting: *Deleted

## 2019-11-27 NOTE — Telephone Encounter (Signed)
Call from patient. Provider rescheduled IUD insert to 12-15-19.  Patient may have started cycle. Per Dr Talbert Nan, proceed with Cytotec for procedure independent of menses.  Patient notified.  Encounter closed.

## 2019-11-27 NOTE — Telephone Encounter (Signed)
-----   Message from Salvadore Dom, MD sent at 11/25/2019  2:38 PM EDT ----- Regarding: RE: Cytotec Yes, thanks! ----- Message ----- From: Huey Romans, RN Sent: 11/24/2019   4:00 PM EDT To: Salvadore Dom, MD Subject: Cytotec                                        I have moved patient to 5-7.  She may be on her cycle by then which I told her would be fine.  Do you want her to take the cytotec if she is on her menses?

## 2019-11-29 DIAGNOSIS — G43909 Migraine, unspecified, not intractable, without status migrainosus: Secondary | ICD-10-CM | POA: Diagnosis not present

## 2019-11-29 DIAGNOSIS — E038 Other specified hypothyroidism: Secondary | ICD-10-CM | POA: Diagnosis not present

## 2019-12-01 ENCOUNTER — Ambulatory Visit: Payer: Self-pay | Admitting: Obstetrics and Gynecology

## 2019-12-04 ENCOUNTER — Telehealth: Payer: Self-pay

## 2019-12-04 MED ORDER — CLOBETASOL PROPIONATE 0.05 % EX OINT: TOPICAL_OINTMENT | CUTANEOUS | 0 refills | Status: DC

## 2019-12-04 NOTE — Telephone Encounter (Signed)
Patient called stating Dr.Jertson was to send in Rx for Clobetasol to pharmacy on file--pharmacy did not receive. Will send to provider to send to [facility].  Rx pending and routed to provider.

## 2019-12-04 NOTE — Telephone Encounter (Signed)
Patient called regarding status of refill for Clobetasol ointment. Patient stated Dr. Talbert Nan, at last appointment (11/13/19), would place refill to pharmacy and that pharmacy has not received request.

## 2019-12-05 NOTE — Telephone Encounter (Signed)
Rx for Clobetasol sent to pharmacy on 12-04-19.

## 2019-12-11 ENCOUNTER — Telehealth: Payer: Self-pay | Admitting: Obstetrics and Gynecology

## 2019-12-11 MED ORDER — INCASSIA 0.35 MG PO TABS: 1.0000 | ORAL_TABLET | Freq: Every day | ORAL | 0 refills | Status: DC

## 2019-12-11 NOTE — Addendum Note (Signed)
Addended by: Georgia Lopes on: 12/11/2019 10:29 AM   Modules accepted: Orders

## 2019-12-11 NOTE — Telephone Encounter (Signed)
Patient has some questions before for a nurse before having her IUD insertion on 12/15/19.

## 2019-12-11 NOTE — Telephone Encounter (Signed)
Left message for pt to return call to triage RN. 

## 2019-12-11 NOTE — Telephone Encounter (Signed)
Call to pt. Pt left detailed message per DPR. Pt given recommendations per Dr Talbert Nan and Rx norethindrone 0.35 mg Alonna Buckler) # 1 package, 0RF sent to pharmacy on file. Pt to call back to office with any questions or concerns.   Routing to Dr Talbert Nan.  Addendum closed.

## 2019-12-11 NOTE — Telephone Encounter (Signed)
Continue micronor until one week after IUD insertion.

## 2019-12-11 NOTE — Telephone Encounter (Signed)
IUD insertion scheduled 12/15/19. On POPs due to migraines with aura.  AEX 11/13/19 with [place] with pt. Pt states had some spotting last week that lasted a couple of days while taking POPs. Pt wants to know if normal and ok to proceed with IUD insertion? Denies heavy bleeding, clots, pain/cramps. Has been on Micronor x 3 months. This spotting is first time it's happened in 3 months.   Pt states will end POPs pack on Wed 12/13/19. Has IUD insertion for scheduled for 12/15/19. Wants to know if needs RF on Micronor? States does not have any RF left. Pt offered to move appt to Tuesday or Wed, pt declined.   Advised will review with Dr Talbert Nan for recommendations and return call to pt. Pt agreeable.   Routing to Dr Talbert Nan

## 2019-12-12 ENCOUNTER — Other Ambulatory Visit: Payer: Self-pay

## 2019-12-12 ENCOUNTER — Ambulatory Visit (INDEPENDENT_AMBULATORY_CARE_PROVIDER_SITE_OTHER): Payer: BC Managed Care – PPO | Admitting: Sports Medicine

## 2019-12-12 VITALS — BP 116/70 | Ht 64.0 in | Wt 115.0 lb

## 2019-12-12 DIAGNOSIS — M25559 Pain in unspecified hip: Secondary | ICD-10-CM | POA: Diagnosis not present

## 2019-12-12 DIAGNOSIS — S76011D Strain of muscle, fascia and tendon of right hip, subsequent encounter: Secondary | ICD-10-CM | POA: Insufficient documentation

## 2019-12-12 DIAGNOSIS — S76011A Strain of muscle, fascia and tendon of right hip, initial encounter: Secondary | ICD-10-CM

## 2019-12-12 NOTE — Assessment & Plan Note (Signed)
Most likely due to slight preference to shift her weight right side.  Less likely is IT band syndrome.  Unlikely to have femoral neck stress fracture given lack of groin pain, negative FADIR.  -We will provide gluteus medius training protocol with home exercises -Follow-up in 6 weeks -If no improvement, consider getting a hip x-ray and formal physical therapy referral

## 2019-12-12 NOTE — Progress Notes (Signed)
    SUBJECTIVE:   CHIEF COMPLAINT / HPI:   Patient presenting with 2 months of right hip pain.  It occurred when she was running.  No definitive injury that she knows of.  She has tried decreasing her physical activity to rest, which did improve the pain.  Then patient started stationary biking followed by swimming.  It seemed to have nearly resolved when she started walking and had resumption of pain.  The pain is on the right lateral hip.  Does not radiate to the groin or the back.  Patient thought it was IT band syndrome and has been doing rolling and stretching at home without any improvement.  PERTINENT  PMH / PSH:  Avid runner  OBJECTIVE:   BP 116/70   Ht 5\' 4"  (1.626 m)   Wt 115 lb (52.2 kg)   BMI 19.74 kg/m   General: No acute distress  Right hip Inspection: No gross effusion or contusion Palpation: Lateral hip is tender to palpation above the trochanteric notch ROM: Patient has normal range of motion flexion extension abduction and adduction of the hip,  Strength: Normal strength in hip adductor's bilaterally Stability: Hip is stable Special tests: Patient has right-sided Trendelenburg gait, negative FADIR and FABER Vascular studies: N/A   ASSESSMENT/PLAN:   Right hip pain secondary to pelvic stabilizer weakness and 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  I think she should continue to cross train until pain-free.  She may slowly introduce running at that time.  Follow-up with me again in 6 weeks.  If symptoms persist, consider x-rays and formal physical therapy.   Bonnita Hollow, MD [place]   Patient seen and evaluated with the resident.  I agree with the above plan of care.

## 2019-12-13 ENCOUNTER — Encounter: Payer: Self-pay | Admitting: Sports Medicine

## 2019-12-15 ENCOUNTER — Other Ambulatory Visit: Payer: Self-pay

## 2019-12-15 ENCOUNTER — Ambulatory Visit (INDEPENDENT_AMBULATORY_CARE_PROVIDER_SITE_OTHER): Payer: BC Managed Care – PPO | Admitting: Obstetrics and Gynecology

## 2019-12-15 ENCOUNTER — Encounter: Payer: Self-pay | Admitting: Obstetrics and Gynecology

## 2019-12-15 ENCOUNTER — Telehealth: Payer: Self-pay

## 2019-12-15 DIAGNOSIS — Z01812 Encounter for preprocedural laboratory examination: Secondary | ICD-10-CM | POA: Diagnosis not present

## 2019-12-15 DIAGNOSIS — Z30430 Encounter for insertion of intrauterine contraceptive device: Secondary | ICD-10-CM | POA: Diagnosis not present

## 2019-12-15 LAB — POCT URINE PREGNANCY: Preg Test, Ur: NEGATIVE

## 2019-12-15 NOTE — Progress Notes (Signed)
GYNECOLOGY  VISIT   HPI: 34 y.o.   Married White or Caucasian Not Hispanic or Latino  female   [id number] with No LMP recorded.   here for IUD insertion. Patient states that she inserted 2 cytotec last night. She also took 800mg  of ibuprofen this morning.   GYNECOLOGIC HISTORY: No LMP recorded. Contraception:POP Menopausal hormone therapy: NA        OB History    Gravida  0   Para  0   Term  0   Preterm  0   AB  0   Living  0     SAB  0   TAB  0   Ectopic  0   Multiple  0   Live Births  0              Patient Active Problem List   Diagnosis Date Noted  . Sprain, gluteus medius, right, initial encounter 12/12/2019  . Migraine without aura and without status migrainosus, not intractable 12/18/2018  . Lichen sclerosus of female genitalia 08/09/2018  . Encounter for oral contraception initial prescription 08/09/2018  . Fatigue 02/26/2011  . SHIN SPLINTS 12/30/2009  . Pain in limb 09/30/2009  . ITBS, RIGHT KNEE 10/24/2008  . BUNIONS, BILATERAL 07/27/2008  . STRESS FRACTURE, TIBIA 03/20/2008  . STRESS FRACTURE OF THE METATARSALS 03/20/2008  . UNEQUAL LEG LENGTH 03/20/2008  . SCOLIOSIS, LUMBAR SPINE 03/20/2008    Past Medical History:  Diagnosis Date  . Broken finger    left pinky  . IBS (irritable bowel syndrome)   . Migraine without aura   . Neuroma   . Thyroid disease     Past Surgical History:  Procedure Laterality Date  . COLONOSCOPY    . FINGER FRACTURE SURGERY Right    5th finger  . WISDOM TOOTH EXTRACTION      Current Outpatient Medications  Medication Sig Dispense Refill  . clobetasol ointment (TEMOVATE) 0.05 % Apply pea size amount to vulva 1-2 x a day for up to 2 weeks as needed. Not for daily Alvizo term use. 60 g 0  . fexofenadine (ALLEGRA) 180 MG tablet Take 180 mg by mouth daily.    . fluticasone (FLONASE) 50 MCG/ACT nasal spray Place into both nostrils daily.    . INCASSIA 0.35 MG tablet Take 1 tablet (0.35 mg total) by mouth daily.  1 Package 0  . levothyroxine (SYNTHROID) 100 MCG tablet Take 100 mcg by mouth daily before breakfast.    . misoprostol (CYTOTEC) 200 MCG tablet Place one tablet vaginally the night before procedure and then place one tablet vaginally morning of procedure. 2 tablet 0  . Multiple Vitamins-Minerals (MULTIVITAMIN WITH MINERALS) tablet Take 1 tablet by mouth daily.      . naratriptan (AMERGE) 2.5 MG tablet Take 1 tablet (2.5 mg total) by mouth as needed for migraine. Take one (1) tablet at onset of headache; may repeat after 2 hours; do not exceed five (5) mg in 24 hours. May take with Compazine (chloproperazine). 10 tablet 6  . Polyethylene Glycol 3350 (MIRALAX PO) Take by mouth.    . prochlorperazine (COMPAZINE) 10 MG tablet Take 1 tablet (10 mg total) by mouth every 6 (six) hours as needed for nausea or vomiting. 30 tablet 6  . topiramate (TOPAMAX) 50 MG tablet Take 1 tablet (50 mg total) by mouth at bedtime. 90 tablet 3   No current facility-administered medications for this visit.     ALLERGIES: Ceclor [cefaclor]  Family History  Problem  Relation Age of Onset  . Cervical cancer Mother   . Hypertension Father   . Thyroid disease Father        hypothyroid  . Thyroid disease Sister        hypothyroid  . Thyroid disease Maternal Aunt        hypothyroid  . Skin cancer Maternal Grandmother   . Colon cancer Paternal Grandfather   . Migraines Neg Hx     Social History   Socioeconomic History  . Marital status: Married    Spouse name: Not on file  . Number of children: 0  . Years of education: Not on file  . Highest education level: Bachelor's degree (e.g., BA, AB, BS)  Occupational History  . Not on file  Tobacco Use  . Smoking status: Never Smoker  . Smokeless tobacco: Never Used  Substance and Sexual Activity  . Alcohol use: No  . Drug use: No  . Sexual activity: Yes    Partners: Male    Birth control/protection: Pill  Other Topics Concern  . Not on file  Social History  Narrative   Lives at home with her husband   Recently moved into a new home   Right handed    Caffeine: 2 cups of coffee daily    Social Determinants of Health   Financial Resource Strain:   . Difficulty of Paying Living Expenses:   Food Insecurity:   . Worried About Charity fundraiser in the Last Year:   . Arboriculturist in the Last Year:   Transportation Needs:   . Film/video editor (Medical):   Marland Kitchen Lack of Transportation (Non-Medical):   Physical Activity:   . Days of Exercise per Week:   . Minutes of Exercise per Session:   Stress:   . Feeling of Stress :   Social Connections:   . Frequency of Communication with Friends and Family:   . Frequency of Social Gatherings with Friends and Family:   . Attends Religious Services:   . Active Member of Clubs or Organizations:   . Attends Archivist Meetings:   Marland Kitchen Marital Status:   Intimate Partner Violence:   . Fear of Current or Ex-Partner:   . Emotionally Abused:   Marland Kitchen Physically Abused:   . Sexually Abused:     Review of Systems  All other systems reviewed and are negative.   PHYSICAL EXAMINATION:    There were no vitals taken for this visit.    General appearance: alert, cooperative and appears stated age  Pelvic: External genitalia:  no lesions              Urethra:  normal appearing urethra with no masses, tenderness or lesions              Bartholins and Skenes: normal                 Vagina: normal appearing vagina with normal color and discharge, no lesions              Cervix: no lesions and stenotic               The risks of the mirena IUD were reviewed with the patient, including infection, abnormal bleeding and uterine perfortion. Consent was signed.  A speculum was placed in the vagina, the cervix was cleansed with betadine. A tenaculum was placed on the cervix, the uterus sounded to 7 cm. The cervix was dilated from a mini-dilator to  a #5 hagar dilator.   The mirena IUD was inserted without  difficulty. The string were cut to 3-4 cm. The tenaculum was removed. Slight oozing from the tenaculum site was stopped with pressure.   The patient tolerated the procedure well.   Chaperone was present for exam.  ASSESSMENT IUD insertion    PLAN Mirena IUD inserted   An After Visit Summary was printed and given to the patient.

## 2019-12-15 NOTE — Telephone Encounter (Signed)
After reviewing with Dr Talbert Nan, pt advised to rest for the remainder of today and monitor for heavy bleeding and clots or severe abd pain. Pt agreeable. ER precautions given. Pt verbalized understanding.   Routing to Dr Talbert Nan for review.  Encounter closed.

## 2019-12-15 NOTE — Patient Instructions (Signed)

## 2019-12-15 NOTE — Telephone Encounter (Signed)
AEX 11/13/19 Mirena IUD inserted today 12/15/19.   Received call from pt.  Spoke with pt. Pt wanting to report that she has been feeling lightheaded and having nausea since leaving office this morning after IUD insertion. Pt states ate mac and cheese, coke, PBJ sandwich and took a nap. After waking, still feeling lightheaded. Pt denies heavy bleeding, clots, fever, chills. Denies abd pain, only light cramping now. Has taken 800 Ibuprofen before procedure this am at 8am.    Pt advised will review with Dr Talbert Nan and return call to pt. Pt agreeable.

## 2019-12-18 ENCOUNTER — Telehealth: Payer: Self-pay

## 2019-12-18 NOTE — Telephone Encounter (Signed)
Patient returned call

## 2019-12-18 NOTE — Telephone Encounter (Signed)
Patient is calling to follow up with instructions for post surgery, (12/15/19). Patient stated the doctor was to put instructions on her notes in mychart but does not see them.

## 2019-12-18 NOTE — Telephone Encounter (Signed)
Left message for pt to return call to triage RN. 

## 2019-12-19 NOTE — Telephone Encounter (Signed)
Left message for pt to return call to triage RN. 

## 2019-12-20 NOTE — Telephone Encounter (Signed)
IUD insertion 12/15/19 AEX 11/13/19 POPs- Incassia 0.35 mg   Spoke with pt. Pt wanting to know how Joffe until she has to use backup method since IUD insertion and how Hajjar to be taking POPs ? Pt advised to use backup method for 2 weeks past IUD insertion and to use POPs for 1 week then stop. Will review with Dr Talbert Nan for any additional recommendations and return call to pt. Pt agreeable and verbalized understanding.   Routing to Dr Talbert Nan.  Encounter closed.

## 2019-12-21 NOTE — Telephone Encounter (Signed)
She only needs back up contraception for one week after IUD insertion. Please inform. Thanks

## 2020-01-01 ENCOUNTER — Other Ambulatory Visit: Payer: Self-pay | Admitting: Obstetrics and Gynecology

## 2020-01-03 DIAGNOSIS — S61259A Open bite of unspecified finger without damage to nail, initial encounter: Secondary | ICD-10-CM | POA: Diagnosis not present

## 2020-01-03 DIAGNOSIS — W5501XA Bitten by cat, initial encounter: Secondary | ICD-10-CM | POA: Diagnosis not present

## 2020-01-15 ENCOUNTER — Telehealth: Payer: Self-pay | Admitting: Obstetrics and Gynecology

## 2020-01-15 NOTE — Telephone Encounter (Signed)
Spoke with Catherine Clay. Catherine Clay wanting to reschedule IUD check due to work schedule. Catherine Clay scheduled with Dr Talbert Nan 6/14 at 4:30 pm per Catherine Clay's request of date and time. Catherine Clay agreeable and verbalized understanding. Catherine Clay denies any problems or concerns at this time.   Routing to Dr Talbert Nan for review.  Encounter closed.

## 2020-01-15 NOTE — Telephone Encounter (Signed)
Left message for pt to return call to triage RN. 

## 2020-01-15 NOTE — Telephone Encounter (Signed)
Patient cancelled 1 month fu and would prefer a Monday or Wednesday late afternoon. Sending to triage to assist with scheduling because of blocked triage slots.

## 2020-01-17 ENCOUNTER — Ambulatory Visit: Payer: BC Managed Care – PPO | Admitting: Obstetrics and Gynecology

## 2020-01-22 ENCOUNTER — Ambulatory Visit: Payer: BC Managed Care – PPO | Admitting: Obstetrics and Gynecology

## 2020-01-23 ENCOUNTER — Telehealth: Payer: Self-pay

## 2020-01-23 ENCOUNTER — Ambulatory Visit: Payer: BC Managed Care – PPO | Admitting: Certified Nurse Midwife

## 2020-01-23 NOTE — Telephone Encounter (Signed)
Left message for pt to return call to triage RN. 

## 2020-01-23 NOTE — Telephone Encounter (Signed)
Patient called in regards to rescheduling IUD check. Need triage to assist next available appointment Is not until (03/20/20)

## 2020-01-23 NOTE — Telephone Encounter (Signed)
Spoke with pt. Pt had arrived late for IUD check appt on 01/22/20. Pt rescheduled for 6/23 at 415 pm with Dr Talbert Nan. Pt agreeable and verbalized understanding of appt. Pt work-in appt due to no available spots on schedule.   Routing to Dr Talbert Nan for review.  Encounter closed.

## 2020-01-29 ENCOUNTER — Other Ambulatory Visit: Payer: Self-pay

## 2020-01-29 ENCOUNTER — Ambulatory Visit (INDEPENDENT_AMBULATORY_CARE_PROVIDER_SITE_OTHER): Payer: BC Managed Care – PPO | Admitting: Sports Medicine

## 2020-01-29 ENCOUNTER — Ambulatory Visit: Payer: BC Managed Care – PPO | Admitting: Sports Medicine

## 2020-01-29 VITALS — BP 100/64 | Ht 64.0 in | Wt 115.0 lb

## 2020-01-29 DIAGNOSIS — M25559 Pain in unspecified hip: Secondary | ICD-10-CM | POA: Diagnosis not present

## 2020-01-29 DIAGNOSIS — S76011D Strain of muscle, fascia and tendon of right hip, subsequent encounter: Secondary | ICD-10-CM

## 2020-01-29 NOTE — Assessment & Plan Note (Signed)
Pain is improving as patient has been consistent in doing her pelvic stabilization exercises. Still with minor right side Trendelenburg drop. We will continue pelvic stabilization exercises and go through 6 weeks of official PT, can follow-up as needed after that, we will consider potential MRI if no improvement. Can alternate run walk periods to test improvement.

## 2020-01-29 NOTE — Patient Instructions (Signed)
We are glad that you are improving, however as you are still having some issues we like you to go and see an official physical therapist in their office for the next 6 weeks. They should contact you soon to set up an appointment, if you do not hear from them please call us back and we can make sure we put you in touch. Continue to do the pelvic stabilization exercises that we have already given you. You can also start to incorporate some intermittent runs and walks to see how they feel, try starting with 1/4 mile run and a three-quarter mile walk ratio. Would like you to start this on a treadmill but if it goes well you can graduate to a trail to see how that treats you. Please let us know if anything changes drastically or you have any questions

## 2020-01-29 NOTE — Progress Notes (Signed)
   Subjective:    Patient ID: Catherine Clay, female    DOB: [birth year], 34 y.o.   MRN: [id number]  HPI   Patient comes in today for follow-up on right hip pain secondary to gluteus medius tendinopathy.  She is improving but is not 100% pain-free.  She has not yet returned to running.  She has returned to recreational walking with little pain.  She walks up to 3 miles at a time.    Review of Systems As above    Objective:   Physical Exam  Well-developed, well-nourished.  No acute distress.  Awake alert and oriented x3.  Vital signs reviewed  Right hip: Still some tenderness to palpation along the insertion of the gluteus medius tendon onto the greater trochanter.  Still with a positive Trendelenburg on the right.  Neurovascular intact distally.      Assessment & Plan:   Right hip pain secondary to gluteus medius tendinopathy  Although the patient is improving, she still has pelvic stabilizer weakness.  I recommended formal physical therapy to address this.  I think she is okay to slowly resume running using pain as her guide.  We will schedule a follow-up visit in 6 weeks but the patient will cancel this if she is feeling better.  If not, then we need to consider imaging at that time.

## 2020-01-29 NOTE — Progress Notes (Signed)
° ° °  SUBJECTIVE:   CHIEF COMPLAINT / HPI: Right gluteus medius strain follow-up  Pain is improving with doing stabilization exercises at home. At this point pain is 1 out of 10 whenever it happens and the most obvious time for her is when she plants her right foot climbing out of a car. She has not tested this injury with running yet but has been walking with no pain during her walks although after a [place] walk it will often hurt her for little while at the house. No change in swelling or pain during her stabilization exercises which she has done 4 to 5 days/week  PERTINENT  PMH / PSH:   OBJECTIVE:   BP 100/64    Ht 5\' 4"  (1.626 m)    Wt 115 lb (52.2 kg)    BMI 19.74 kg/m   General: Alert, athletic, no distress Right hip exam: Inspection: Obstructed by patient's pants but no obvious deformity Palpation: No pain to palpation, but patient was able to identify superior trochanter on the right side as the location of her most common pain ROM: Normal range of motion with no deficits noted Strength: Good hip abductor strength bilaterally when lying down, minor right-sided Trendelenburg drop, could do 1 legged squats to 45 degrees bilaterally with only minor tenderness on right side Stability: No indication of hip instability throughout range of motion Special tests: Negative FABER/FADIR, minor right-sided Trendelenburg drop Neurovascular: No distal neurologic sensation or motor control deficits   ASSESSMENT/PLAN:   Sprain, gluteus medius, right, subsequent encounter Pain is improving as patient has been consistent in doing her pelvic stabilization exercises. Still with minor right side Trendelenburg drop. We will continue pelvic stabilization exercises and go through 6 weeks of official PT, can follow-up as needed after that, we will consider potential MRI if no improvement. Can alternate run walk periods to test improvement.     Sherene Sires, [place]

## 2020-01-31 ENCOUNTER — Ambulatory Visit: Payer: BC Managed Care – PPO | Admitting: Obstetrics and Gynecology

## 2020-02-01 ENCOUNTER — Telehealth: Payer: Self-pay

## 2020-02-01 NOTE — Telephone Encounter (Signed)
Patient arrived late for appointment on (01/31/20) and patient was not seen. Patient declined to reschedule at that time.

## 2020-02-01 NOTE — Telephone Encounter (Signed)
Left message for pt to return call to triage RN. 

## 2020-02-01 NOTE — Telephone Encounter (Signed)
Spoke with pt. Pt states being frustrated and upset after was late yesterday to appt and wasn't seen. Pt also states understanding policy. Advised pt the importance of having IUD check. Pt agreeable. IUD placed 12/15/19.  Pt rescheduled IUD check for 02/16/20 at 10 am with Dr Talbert Nan. Pt states will be off work and school this day. Pt verbalized understanding and agreeable to date and time of appt. Pt denies any problems, concerns with IUD at this time. Pt states had no cycle in 2-3 months.   Routing to Dr Talbert Nan for review.  Encounter closed.

## 2020-02-09 DIAGNOSIS — Z0000 Encounter for general adult medical examination without abnormal findings: Secondary | ICD-10-CM | POA: Diagnosis not present

## 2020-02-09 DIAGNOSIS — D229 Melanocytic nevi, unspecified: Secondary | ICD-10-CM | POA: Diagnosis not present

## 2020-02-16 ENCOUNTER — Other Ambulatory Visit: Payer: Self-pay

## 2020-02-16 ENCOUNTER — Encounter: Payer: Self-pay | Admitting: Obstetrics and Gynecology

## 2020-02-16 ENCOUNTER — Ambulatory Visit (INDEPENDENT_AMBULATORY_CARE_PROVIDER_SITE_OTHER): Payer: BC Managed Care – PPO | Admitting: Obstetrics and Gynecology

## 2020-02-16 VITALS — BP 126/72 | HR 109 | Ht 64.0 in | Wt 110.0 lb

## 2020-02-16 DIAGNOSIS — Z30431 Encounter for routine checking of intrauterine contraceptive device: Secondary | ICD-10-CM | POA: Diagnosis not present

## 2020-02-16 NOTE — Progress Notes (Signed)
GYNECOLOGY  VISIT   HPI: 34 y.o.   Married White or Caucasian Not Hispanic or Latino  female   [id number] with Patient's last menstrual period was 02/01/2020 (within days).   here for  IUD follow up. She had a mirena placed in 5/21. She spotted initially. One light cycle. No pain. No dyspareunia.   GYNECOLOGIC HISTORY: Patient's last menstrual period was 02/01/2020 (within days). Contraception:IUD  Menopausal hormone therapy: none         OB History    Gravida  0   Para  0   Term  0   Preterm  0   AB  0   Living  0     SAB  0   TAB  0   Ectopic  0   Multiple  0   Live Births  0              Patient Active Problem List   Diagnosis Date Noted  . Sprain, gluteus medius, right, subsequent encounter 12/12/2019  . Migraine without aura and without status migrainosus, not intractable 12/18/2018  . Lichen sclerosus of female genitalia 08/09/2018  . Encounter for oral contraception initial prescription 08/09/2018  . Fatigue 02/26/2011  . SHIN SPLINTS 12/30/2009  . Pain in limb 09/30/2009  . ITBS, RIGHT KNEE 10/24/2008  . BUNIONS, BILATERAL 07/27/2008  . STRESS FRACTURE, TIBIA 03/20/2008  . STRESS FRACTURE OF THE METATARSALS 03/20/2008  . UNEQUAL LEG LENGTH 03/20/2008  . SCOLIOSIS, LUMBAR SPINE 03/20/2008    Past Medical History:  Diagnosis Date  . Broken finger    left pinky  . IBS (irritable bowel syndrome)   . Migraine without aura   . Neuroma   . Thyroid disease     Past Surgical History:  Procedure Laterality Date  . COLONOSCOPY    . FINGER FRACTURE SURGERY Right    5th finger  . WISDOM TOOTH EXTRACTION      Current Outpatient Medications  Medication Sig Dispense Refill  . clobetasol ointment (TEMOVATE) 0.05 % Apply pea size amount to vulva 1-2 x a day for up to 2 weeks as needed. Not for daily [place] term use. 60 g 0  . fexofenadine (ALLEGRA) 180 MG tablet Take 180 mg by mouth daily.    . fluticasone (FLONASE) 50 MCG/ACT nasal spray Place into  both nostrils daily.    . INCASSIA 0.35 MG tablet Take 1 tablet (0.35 mg total) by mouth daily. 1 Package 0  . levothyroxine (SYNTHROID) 100 MCG tablet Take 100 mcg by mouth daily before breakfast.    . misoprostol (CYTOTEC) 200 MCG tablet Place one tablet vaginally the night before procedure and then place one tablet vaginally morning of procedure. 2 tablet 0  . Multiple Vitamins-Minerals (MULTIVITAMIN WITH MINERALS) tablet Take 1 tablet by mouth daily.      . naratriptan (AMERGE) 2.5 MG tablet Take 1 tablet (2.5 mg total) by mouth as needed for migraine. Take one (1) tablet at onset of headache; may repeat after 2 hours; do not exceed five (5) mg in 24 hours. May take with Compazine (chloproperazine). 10 tablet 6  . Polyethylene Glycol 3350 (MIRALAX PO) Take by mouth.    . prochlorperazine (COMPAZINE) 10 MG tablet Take 1 tablet (10 mg total) by mouth every 6 (six) hours as needed for nausea or vomiting. 30 tablet 6  . topiramate (TOPAMAX) 50 MG tablet Take 1 tablet (50 mg total) by mouth at bedtime. 90 tablet 3   No current facility-administered medications for  this visit.     ALLERGIES: Ceclor [cefaclor]  Family History  Problem Relation Age of Onset  . Cervical cancer Mother   . Hypertension Father   . Thyroid disease Father        hypothyroid  . Thyroid disease Sister        hypothyroid  . Thyroid disease Maternal Aunt        hypothyroid  . Skin cancer Maternal Grandmother   . Colon cancer Paternal Grandfather   . Migraines Neg Hx     Social History   Socioeconomic History  . Marital status: Married    Spouse name: Not on file  . Number of children: 0  . Years of education: Not on file  . Highest education level: Bachelor's degree (e.g., BA, AB, BS)  Occupational History  . Not on file  Tobacco Use  . Smoking status: Never Smoker  . Smokeless tobacco: Never Used  Vaping Use  . Vaping Use: Never used  Substance and Sexual Activity  . Alcohol use: No  . Drug use: No   . Sexual activity: Yes    Partners: Male    Birth control/protection: Pill  Other Topics Concern  . Not on file  Social History Narrative   Lives at home with her husband   Recently moved into a new home   Right handed    Caffeine: 2 cups of coffee daily    Social Determinants of Health   Financial Resource Strain:   . Difficulty of Paying Living Expenses:   Food Insecurity:   . Worried About Charity fundraiser in the Last Year:   . Arboriculturist in the Last Year:   Transportation Needs:   . Film/video editor (Medical):   Marland Kitchen Lack of Transportation (Non-Medical):   Physical Activity:   . Days of Exercise per Week:   . Minutes of Exercise per Session:   Stress:   . Feeling of Stress :   Social Connections:   . Frequency of Communication with Friends and Family:   . Frequency of Social Gatherings with Friends and Family:   . Attends Religious Services:   . Active Member of Clubs or Organizations:   . Attends Archivist Meetings:   Marland Kitchen Marital Status:   Intimate Partner Violence:   . Fear of Current or Ex-Partner:   . Emotionally Abused:   Marland Kitchen Physically Abused:   . Sexually Abused:     Review of Systems  All other systems reviewed and are negative.   PHYSICAL EXAMINATION:    BP 126/72   Pulse (!) 109   Ht 5\' 4"  (1.626 m)   Wt 110 lb (49.9 kg)   LMP 02/01/2020 (Within Days)   SpO2 100%   BMI 18.88 kg/m     General appearance: alert, cooperative and appears stated age   Pelvic: External genitalia:  no lesions, mild whitening on the clitoris, labia minora, some loss of architecture. No fissures, no plaques.               Urethra:  normal appearing urethra with no masses, tenderness or lesions              Bartholins and Skenes: normal                 Vagina: normal appearing vagina with normal color and discharge, no lesions              Cervix: no lesions and IUD string  1 cm              Bimanual Exam:  Uterus:  normal size, contour, position,  consistency, mobility, non-tender              Adnexa: no mass, fullness, tenderness              Chaperone was present for exam.  ASSESSMENT IUD check, doing well  Mild vulvar whitening, h/o lichen sclerosis. Uses steroids prn    PLAN Routine f/u

## 2020-03-15 DIAGNOSIS — Z20822 Contact with and (suspected) exposure to covid-19: Secondary | ICD-10-CM | POA: Diagnosis not present

## 2020-03-30 DIAGNOSIS — Z20822 Contact with and (suspected) exposure to covid-19: Secondary | ICD-10-CM | POA: Diagnosis not present

## 2020-03-31 DIAGNOSIS — R002 Palpitations: Secondary | ICD-10-CM | POA: Diagnosis not present

## 2020-03-31 DIAGNOSIS — R0602 Shortness of breath: Secondary | ICD-10-CM | POA: Diagnosis not present

## 2020-03-31 DIAGNOSIS — R42 Dizziness and giddiness: Secondary | ICD-10-CM | POA: Diagnosis not present

## 2020-03-31 DIAGNOSIS — R0981 Nasal congestion: Secondary | ICD-10-CM | POA: Diagnosis not present

## 2020-04-02 DIAGNOSIS — R0602 Shortness of breath: Secondary | ICD-10-CM | POA: Diagnosis not present

## 2020-05-29 ENCOUNTER — Encounter: Payer: Self-pay | Admitting: Neurology

## 2020-05-29 ENCOUNTER — Ambulatory Visit (INDEPENDENT_AMBULATORY_CARE_PROVIDER_SITE_OTHER): Payer: BC Managed Care – PPO | Admitting: Neurology

## 2020-05-29 VITALS — BP 112/65 | HR 69 | Ht 64.0 in | Wt 107.0 lb

## 2020-05-29 DIAGNOSIS — G43009 Migraine without aura, not intractable, without status migrainosus: Secondary | ICD-10-CM | POA: Diagnosis not present

## 2020-05-29 MED ORDER — NARATRIPTAN HCL 2.5 MG PO TABS: 2.5000 mg | ORAL_TABLET | ORAL | 11 refills | Status: DC | PRN

## 2020-05-29 MED ORDER — PROCHLORPERAZINE MALEATE 10 MG PO TABS: 10.0000 mg | ORAL_TABLET | Freq: Four times a day (QID) | ORAL | 11 refills | Status: DC | PRN

## 2020-05-29 MED ORDER — TOPIRAMATE 50 MG PO TABS: 50.0000 mg | ORAL_TABLET | Freq: Every day | ORAL | 4 refills | Status: DC

## 2020-05-29 NOTE — Progress Notes (Signed)
[facility]    Provider:  Dr Jaynee Eagles Requesting Provider: No ref. provider found Primary Care Provider:  Izora Gala, MD  CC:  Headache   Interval history 05/29/2020: She is doing well. Motion still triggers her migraines. More motion triggered. She can try the naratriptan in advance. IUD helped, will just refill the naratriptan. Try 1/2 dose of Topamax.   Interval history 05/29/2019: She is doing well. She is on Topamax 25mg , increase to 50mg . We discussed side effects. She is a Education officer, museum. Worried about the side effects. Email me if side effects and we can try something different. MRI was too expensive.   Interval history 02/21/2019: her migraines are more frequent in the last few months since being seen. The acute medication works but she is having migraine 2-4 times a week.  No inciting event. Maybe mask use a lot. Also she has a new job and she can't eat at her job and drink often which also may be the cause, she is working a summer job at [employer] and she only gets one 15-min break over 6 hours. She gets a migraine when she works but also being in a car triggers the migraines moreso if she is a passenger but also when she is driving herself.   HPI:  Catherine Clay is a 34 y.o. female here as requested by No ref. provider found for migraines. Started about 3 years ago. Horrible headache in the temples, pressure, nausea, motion sickness, nothing helped (ibuprofen, benadryl), + vomiting. Happened twice a year after it was the worse headache for a week, couldn't move, couldn't read, couldn't look at a screen, blurred vision. She went to the ED and CT was negative of the head. Happened 2-3x that year. This past year in 2019 happened about the same time in September and has been worsening and becoming more frequent it occurred over 3 months from Sept-December. Motion sickness is worse, she feels sick in the car with a headache. It is pounding, pulsating, throbbing, bilaterally.  She can't ride in a car without some queasiness and lasts for 20 minutes may not be followed by a headache.  She has blurred vision. Headaches are worse with exertion, valsalva, bending over. No morning or nocturnal headaches. No Fhx of migraines. No other focal neurologic deficits, associated symptoms, inciting events or modifiable factors.  Reviewed notes, labs and imaging from outside physicians, which showed:  Reviewed emergency room notes.  Patient was seen in the emergency room for migraines and dizziness, temporal, also generalized fatigue and intermittent dizziness.  Started with feeling intermittently dizzy with a temporal frontal migraine that gradually increased in severity.  She describes her dizziness as feeling seasick.  She had one episode of vomiting.  She has a history of fluid behind her ears and vertigo.  At that denied she denied any history of migraines.  She denied any photophobia, vision changes, fever, chest pain, shortness of breath or any other associated symptoms.  Ibuprofen and Tylenol did not help.  They noted a middle ear and fusion.  Also muscular tenderness in the neck.  Otherwise neurologic and physical exam were normal.  Her sodium was however 129.  Reviewed CT of the head report September 2018 which was normal except for mild mucosal thickening of the bilateral ethmoid sinuses.  Review of Systems: Patient complains of symptoms per HPI as well as the following symptoms: headaches, dizziness. Pertinent negatives and positives per HPI. All others negative.   Social History  Socioeconomic History   Marital status: Married    Spouse name: Not on file   Number of children: 0   Years of education: Not on file   Highest education level: Bachelor's degree (e.g., BA, AB, BS)  Occupational History   Not on file  Tobacco Use   Smoking status: Never Smoker   Smokeless tobacco: Never Used  Vaping Use   Vaping Use: Never used  Substance and Sexual Activity    Alcohol use: No   Drug use: No   Sexual activity: Yes    Partners: Male    Birth control/protection: I.U.D.  Other Topics Concern   Not on file  Social History Narrative   Lives at home with her husband   Recently moved into a new home   Right handed    Caffeine: 2 cups of coffee daily    Social Determinants of Health   Financial Resource Strain:    Difficulty of Paying Living Expenses: Not on file  Food Insecurity:    Worried About [place] in the Last Year: Not on file   Ran Out of Food in the Last Year: Not on file  Transportation Needs:    Lack of Transportation (Medical): Not on file   Lack of Transportation (Non-Medical): Not on file  Physical Activity:    Days of Exercise per Week: Not on file   Minutes of Exercise per Session: Not on file  Stress:    Feeling of Stress : Not on file  Social Connections:    Frequency of Communication with Friends and Family: Not on file   Frequency of Social Gatherings with Friends and Family: Not on file   Attends Religious Services: Not on file   Active Member of Clubs or Organizations: Not on file   Attends Archivist Meetings: Not on file   Marital Status: Not on file  Intimate Partner Violence:    Fear of Current or Ex-Partner: Not on file   Emotionally Abused: Not on file   Physically Abused: Not on file   Sexually Abused: Not on file    Family History  Problem Relation Age of Onset   Cervical cancer Mother    Hypertension Father    Thyroid disease Father        hypothyroid   Thyroid disease Sister        hypothyroid   Thyroid disease Maternal Aunt        hypothyroid   Skin cancer Maternal Grandmother    Colon cancer Paternal Grandfather    Migraines Neg Hx     Past Medical History:  Diagnosis Date   Broken finger    left pinky   IBS (irritable bowel syndrome)    Migraine without aura    Neuroma    Thyroid disease     Patient Active Problem List    Diagnosis Date Noted   Sprain, gluteus medius, right, subsequent encounter 12/12/2019   Migraine without aura and without status migrainosus, not intractable 62/37/6283   Lichen sclerosus of female genitalia 08/09/2018   Encounter for oral contraception initial prescription 08/09/2018   Fatigue 02/26/2011   SHIN SPLINTS 12/30/2009   Pain in limb 09/30/2009   ITBS, RIGHT KNEE 10/24/2008   BUNIONS, BILATERAL 07/27/2008   STRESS FRACTURE, TIBIA 03/20/2008   STRESS FRACTURE OF THE METATARSALS 03/20/2008   UNEQUAL LEG LENGTH 03/20/2008   SCOLIOSIS, LUMBAR SPINE 03/20/2008    Past Surgical History:  Procedure Laterality Date   COLONOSCOPY  FINGER FRACTURE SURGERY Right    5th finger   WISDOM TOOTH EXTRACTION      Current Outpatient Medications  Medication Sig Dispense Refill   clobetasol ointment (TEMOVATE) 0.05 % Apply pea size amount to vulva 1-2 x a day for up to 2 weeks as needed. Not for daily Corbo term use. 60 g 0   fexofenadine (ALLEGRA) 180 MG tablet Take 180 mg by mouth daily.     fluticasone (FLONASE) 50 MCG/ACT nasal spray Place into both nostrils daily.     levonorgestrel (MIRENA) 20 MCG/24HR IUD 1 each by Intrauterine route once.     levothyroxine (SYNTHROID) 100 MCG tablet Take 100 mcg by mouth daily before breakfast.     Multiple Vitamins-Minerals (MULTIVITAMIN WITH MINERALS) tablet Take 1 tablet by mouth daily.       naratriptan (AMERGE) 2.5 MG tablet Take 1 tablet (2.5 mg total) by mouth as needed for migraine. Take one (1) tablet at onset of headache; may repeat after 2 hours; do not exceed five (5) mg in 24 hours. May take with Compazine (chloproperazine). 10 tablet 11   Polyethylene Glycol 3350 (MIRALAX PO) Take by mouth.     prochlorperazine (COMPAZINE) 10 MG tablet Take 1 tablet (10 mg total) by mouth every 6 (six) hours as needed for nausea or vomiting. 30 tablet 11   topiramate (TOPAMAX) 50 MG tablet Take 1 tablet (50 mg total) by  mouth at bedtime. 90 tablet 4   No current facility-administered medications for this visit.    Allergies as of 05/29/2020 - Review Complete 05/29/2020  Allergen Reaction Noted   Ceclor [cefaclor] Hives 02/26/2011    Vitals: BP 112/65 (BP Location: Right Arm, Patient Position: Sitting)    Pulse 69    Ht 5\' 4"  (1.626 m)    Wt 107 lb (48.5 kg)    BMI 18.37 kg/m  Last Weight:  Wt Readings from Last 1 Encounters:  05/29/20 107 lb (48.5 kg)   Last Height:   Ht Readings from Last 1 Encounters:  05/29/20 5\' 4"  (1.626 m)    Physical exam: Exam: Gen: NAD, conversant, well nourised, , well groomed                     Eyes: Conjunctivae clear without exudates or hemorrhage  Neuro: Detailed Neurologic Exam  Speech:    Speech is normal; fluent and spontaneous with normal comprehension.  Cognition:    The patient is oriented to person, place, and time;     recent and remote memory intact;     language fluent;     normal attention, concentration,     fund of knowledge Cranial Nerves:    The pupils are equal, round, and reactive to light.  Visual fields are full to finger confrontation. Extraocular movements are intact. Trigeminal sensation is intact and the muscles of mastication are normal. The face is symmetric. The palate elevates in the midline. Hearing intact. Voice is normal. Shoulder shrug is normal. The tongue has normal motion without fasciculations.    Motor Observation:    No asymmetry, no atrophy, and no involuntary movements noted. Tone:    Normal muscle tone.    Posture:    Posture is normal. normal erect    Strength:    Strength is V/V in the upper and lower limbs.      Assessment/Plan: This is a 34 year old patient with likely migraines.    - . Continue naratriptan and compazine for acute management. Do not  get pregnant, discussed teratogenicity. Doing well on IUD. Still triggered by movement/,motion sensitive. Can try Naratriptan preventatively if she is  doing something she thinks may trigger migraines.  - Labs: she sees an endocrinologist and he follows her thyroid.   - I emailed patient an overview of migraines and migraine management in the past. In past gave info on vestibular symptoms in migraines.  Meds ordered this encounter  Medications   topiramate (TOPAMAX) 50 MG tablet    Sig: Take 1 tablet (50 mg total) by mouth at bedtime.    Dispense:  90 tablet    Refill:  4   naratriptan (AMERGE) 2.5 MG tablet    Sig: Take 1 tablet (2.5 mg total) by mouth as needed for migraine. Take one (1) tablet at onset of headache; may repeat after 2 hours; do not exceed five (5) mg in 24 hours. May take with Compazine (chloproperazine).    Dispense:  10 tablet    Refill:  11   prochlorperazine (COMPAZINE) 10 MG tablet    Sig: Take 1 tablet (10 mg total) by mouth every 6 (six) hours as needed for nausea or vomiting.    Dispense:  30 tablet    Refill:  11     Discussed: To prevent or relieve headaches, try the following: Cool Compress. Lie down and place a cool compress on your head.  Avoid headache triggers. If certain foods or odors seem to have triggered your migraines in the past, avoid them. A headache diary might help you identify triggers.  Include physical activity in your daily routine. Try a daily walk or other moderate aerobic exercise.  Manage stress. Find healthy ways to cope with the stressors, such as delegating tasks on your to-do list.  Practice relaxation techniques. Try deep breathing, yoga, massage and visualization.  Eat regularly. Eating regularly scheduled meals and maintaining a healthy diet might help prevent headaches. Also, drink plenty of fluids.  Follow a regular sleep schedule. Sleep deprivation might contribute to headaches Consider biofeedback. With this mind-body technique, you learn to control certain bodily functions -- such as muscle tension, heart rate and blood pressure -- to prevent headaches or reduce  headache pain.    Proceed to emergency room if you experience new or worsening symptoms or symptoms do not resolve, if you have new neurologic symptoms or if headache is severe, or for any concerning symptom.   Provided education and documentation from American headache Society toolbox including articles on: chronic migraine medication overuse headache, chronic migraines, prevention of migraines, behavioral and other nonpharmacologic treatments for headache.   Sarina Ill, MD  [facility] [address] [place], [place] [address]  Phone [phone] Fax [phone]  I spent 15 minutes of face-to-face and non-face-to-face time with patient on the  1. Migraine without aura and without status migrainosus, not intractable    diagnosis.  This included previsit chart review, lab review, study review, order entry, electronic health record documentation, patient education on the different diagnostic and therapeutic options, counseling and coordination of care, risks and benefits of management, compliance, or risk factor reduction

## 2020-06-14 DIAGNOSIS — D1801 Hemangioma of skin and subcutaneous tissue: Secondary | ICD-10-CM | POA: Diagnosis not present

## 2020-06-14 DIAGNOSIS — D225 Melanocytic nevi of trunk: Secondary | ICD-10-CM | POA: Diagnosis not present

## 2020-07-02 ENCOUNTER — Other Ambulatory Visit: Payer: Self-pay | Admitting: Neurology

## 2020-07-02 MED ORDER — METHYLPREDNISOLONE 4 MG PO TBPK: ORAL_TABLET | ORAL | 1 refills | Status: DC

## 2020-07-02 NOTE — Progress Notes (Signed)
me

## 2020-11-11 DIAGNOSIS — L245 Irritant contact dermatitis due to other chemical products: Secondary | ICD-10-CM | POA: Diagnosis not present

## 2020-11-28 DIAGNOSIS — L719 Rosacea, unspecified: Secondary | ICD-10-CM | POA: Diagnosis not present

## 2020-12-06 DIAGNOSIS — G43909 Migraine, unspecified, not intractable, without status migrainosus: Secondary | ICD-10-CM | POA: Diagnosis not present

## 2020-12-06 DIAGNOSIS — E039 Hypothyroidism, unspecified: Secondary | ICD-10-CM | POA: Diagnosis not present

## 2020-12-26 DIAGNOSIS — L719 Rosacea, unspecified: Secondary | ICD-10-CM | POA: Diagnosis not present

## 2021-01-22 ENCOUNTER — Ambulatory Visit: Payer: BC Managed Care – PPO | Admitting: Obstetrics and Gynecology

## 2021-01-29 ENCOUNTER — Telehealth: Payer: Self-pay | Admitting: Neurology

## 2021-01-29 ENCOUNTER — Other Ambulatory Visit: Payer: Self-pay | Admitting: Neurology

## 2021-01-29 MED ORDER — TOPIRAMATE 100 MG PO TABS: 100.0000 mg | ORAL_TABLET | Freq: Every day | ORAL | 3 refills | Status: DC

## 2021-01-29 NOTE — Telephone Encounter (Signed)
Jillian, can you find this patient an follow up appointment with me within the next 3-4 weeks? It can be a video appointment. Do I have anything open or any days I don't have a 4pm?

## 2021-01-29 NOTE — Telephone Encounter (Signed)
Scheduled pt for 7/19 MyChart visit at 7:30 am with Dr. Jaynee Eagles.

## 2021-01-30 ENCOUNTER — Telehealth: Payer: Self-pay | Admitting: Neurology

## 2021-01-30 NOTE — Telephone Encounter (Signed)
Pt has been notified through My chart

## 2021-01-30 NOTE — Telephone Encounter (Signed)
Can you offer her an appointment on July 7th looks like I have a 2pm available? Also let her know that I called in topiramate 100mg  tab. If she has side effects she can go back to the 50 and we can discuss via video on July 7th thanks

## 2021-02-07 DIAGNOSIS — L719 Rosacea, unspecified: Secondary | ICD-10-CM | POA: Diagnosis not present

## 2021-02-25 ENCOUNTER — Encounter: Payer: Self-pay | Admitting: Neurology

## 2021-02-25 ENCOUNTER — Telehealth (INDEPENDENT_AMBULATORY_CARE_PROVIDER_SITE_OTHER): Payer: BC Managed Care – PPO | Admitting: Neurology

## 2021-02-25 ENCOUNTER — Telehealth: Payer: Self-pay | Admitting: Neurology

## 2021-02-25 DIAGNOSIS — G43009 Migraine without aura, not intractable, without status migrainosus: Secondary | ICD-10-CM

## 2021-02-25 MED ORDER — ONDANSETRON HCL 8 MG PO TABS: 8.0000 mg | ORAL_TABLET | Freq: Three times a day (TID) | ORAL | 11 refills | Status: DC | PRN

## 2021-02-25 MED ORDER — RIZATRIPTAN BENZOATE 10 MG PO TBDP: 10.0000 mg | ORAL_TABLET | ORAL | 11 refills | Status: DC | PRN

## 2021-02-25 MED ORDER — PROCHLORPERAZINE MALEATE 10 MG PO TABS: 10.0000 mg | ORAL_TABLET | Freq: Four times a day (QID) | ORAL | 11 refills | Status: DC | PRN

## 2021-02-25 MED ORDER — QULIPTA 60 MG PO TABS: 60.0000 mg | ORAL_TABLET | Freq: Every day | ORAL | 11 refills | Status: DC

## 2021-02-25 NOTE — Progress Notes (Signed)
[facility]    Provider:  Dr Jaynee Eagles Requesting Provider: No ref. provider found Primary Care Provider:  Izora Gala, MD  CC:  Headache   Virtual Visit via Video Note  I connected with [name] on 02/25/21 at  7:30 AM EDT by a video enabled telemedicine application and verified that I am speaking with the correct person using two identifiers.  Location: Patient: home  Provider: office   I discussed the limitations of evaluation and management by telemedicine and the availability of in person appointments. The patient expressed understanding and agreed to proceed.   Follow Up Instructions:    I discussed the assessment and treatment plan with the patient. The patient was provided an opportunity to ask questions and all were answered. The patient agreed with the plan and demonstrated an understanding of the instructions.   The patient was advised to call back or seek an in-person evaluation if the symptoms worsen or if the condition fails to improve as anticipated.  I provided 30 minutes of non-face-to-face time during this encounter.   Melvenia Beam, MD  Interval history February 25, 2021: This is a patient with migraines, she has a lot of issues with motion that can trigger her migraines, we have tried naratriptan and Zofran when she is in a car, and IUD has helped with her migraines. She was having them twice a week. When she takes her preventative medications she needs more. Also ice packs. She takes a triptan and the zofran, it doesn't seem to take it away and makes it less but not completely. She still has to sit in a quiet room, it hit her at works and she had to put cold sodas on her head. They are more frequent and less responsive for 2 months. We increased her topiramate and it may not be helping. She is having 6 migraine days a month.   Medications tried that can be used in migraine management include Benadryl injection, Topamax, B complex, Toradol  injection, Medrol Dosepak, Amerge, Zofran, Imitrex, Compazine, Maxalt.  We increased her Topamax, she was worried about mental awareness, due to her recent migraines in the past several months increasing frequency, she has been taking her acute management medication to 1-2 times a week for several months which seems like a lot(6 migraines total).  Interval history 05/29/2020: She is doing well. Motion still triggers her migraines. More motion triggered. She can try the naratriptan in advance. IUD helped, will just refill the naratriptan. Try 1/2 dose of Topamax.   Interval history 05/29/2019: She is doing well. She is on Topamax 25mg , increase to 50mg . We discussed side effects. She is a Education officer, museum. Worried about the side effects. Email me if side effects and we can try something different. MRI was too expensive.   Interval history 02/21/2019: her migraines are more frequent in the last few months since being seen. The acute medication works but she is having migraine 2-4 times a week.  No inciting event. Maybe mask use a lot. Also she has a new job and she can't eat at her job and drink often which also may be the cause, she is working a summer job at [employer] and she only gets one 15-min break over 6 hours. She gets a migraine when she works but also being in a car triggers the migraines moreso if she is a passenger but also when she is driving herself.   HPI:  Catherine Clay is a 35  y.o. female here as requested by No ref. provider found for migraines. Started about 3 years ago. Horrible headache in the temples, pressure, nausea, motion sickness, nothing helped (ibuprofen, benadryl), + vomiting. Happened twice a year after it was the worse headache for a week, couldn't move, couldn't read, couldn't look at a screen, blurred vision. She went to the ED and CT was negative of the head. Happened 2-3x that year. This past year in 2019 happened about the same time in September and has been worsening and becoming  more frequent it occurred over 3 months from Sept-December. Motion sickness is worse, she feels sick in the car with a headache. It is pounding, pulsating, throbbing, bilaterally. She can't ride in a car without some queasiness and lasts for 20 minutes may not be followed by a headache.  She has blurred vision. Headaches are worse with exertion, valsalva, bending over. No morning or nocturnal headaches. No Fhx of migraines. No other focal neurologic deficits, associated symptoms, inciting events or modifiable factors.  Reviewed notes, labs and imaging from outside physicians, which showed:  Reviewed emergency room notes.  Patient was seen in the emergency room for migraines and dizziness, temporal, also generalized fatigue and intermittent dizziness.  Started with feeling intermittently dizzy with a temporal frontal migraine that gradually increased in severity.  She describes her dizziness as feeling seasick.  She had one episode of vomiting.  She has a history of fluid behind her ears and vertigo.  At that denied she denied any history of migraines.  She denied any photophobia, vision changes, fever, chest pain, shortness of breath or any other associated symptoms.  Ibuprofen and Tylenol did not help.  They noted a middle ear and fusion.  Also muscular tenderness in the neck.  Otherwise neurologic and physical exam were normal.  Her sodium was however 129.  Reviewed CT of the head report September 2018 which was normal except for mild mucosal thickening of the bilateral ethmoid sinuses.  Review of Systems: Patient complains of symptoms per HPI as well as the following symptoms: headaches, dizziness. Pertinent negatives and positives per HPI. All others negative.   Social History   Socioeconomic History   Marital status: Married    Spouse name: Not on file   Number of children: 0   Years of education: Not on file   Highest education level: Bachelor's degree (e.g., BA, AB, BS)  Occupational  History   Not on file  Tobacco Use   Smoking status: Never   Smokeless tobacco: Never  Vaping Use   Vaping Use: Never used  Substance and Sexual Activity   Alcohol use: No   Drug use: No   Sexual activity: Yes    Partners: Male    Birth control/protection: I.U.D.  Other Topics Concern   Not on file  Social History Narrative   Lives at home with her husband   Recently moved into a new home   Right handed    Caffeine: 2 cups of coffee daily    Social Determinants of Health   Financial Resource Strain: Not on file  Food Insecurity: Not on file  Transportation Needs: Not on file  Physical Activity: Not on file  Stress: Not on file  Social Connections: Not on file  Intimate Partner Violence: Not on file    Family History  Problem Relation Age of Onset   Cervical cancer Mother    Hypertension Father    Thyroid disease Father  hypothyroid   Thyroid disease Sister        hypothyroid   Thyroid disease Maternal Aunt        hypothyroid   Skin cancer Maternal Grandmother    Colon cancer Paternal Grandfather    Migraines Neg Hx     Past Medical History:  Diagnosis Date   Broken finger    left pinky   IBS (irritable bowel syndrome)    Migraine without aura    Neuroma    Thyroid disease     Patient Active Problem List   Diagnosis Date Noted   Sprain, gluteus medius, right, subsequent encounter 12/12/2019   Migraine without aura and without status migrainosus, not intractable 03/50/0938   Lichen sclerosus of female genitalia 08/09/2018   Encounter for oral contraception initial prescription 08/09/2018   Fatigue 02/26/2011   SHIN SPLINTS 12/30/2009   Pain in limb 09/30/2009   ITBS, RIGHT KNEE 10/24/2008   BUNIONS, BILATERAL 07/27/2008   STRESS FRACTURE, TIBIA 03/20/2008   STRESS FRACTURE OF THE METATARSALS 03/20/2008   UNEQUAL LEG LENGTH 03/20/2008   SCOLIOSIS, LUMBAR SPINE 03/20/2008    Past Surgical History:  Procedure Laterality Date   COLONOSCOPY      FINGER FRACTURE SURGERY Right    5th finger   WISDOM TOOTH EXTRACTION      Current Outpatient Medications  Medication Sig Dispense Refill   ondansetron (ZOFRAN) 8 MG tablet Take 1 tablet (8 mg total) by mouth every 8 (eight) hours as needed for nausea or vomiting. 30 tablet 11   rizatriptan (MAXALT-MLT) 10 MG disintegrating tablet Take 1 tablet (10 mg total) by mouth as needed for migraine. May repeat in 2 hours if needed 9 tablet 11   clobetasol ointment (TEMOVATE) 0.05 % Apply pea size amount to vulva 1-2 x a day for up to 2 weeks as needed. Not for daily Perra term use. 60 g 0   fexofenadine (ALLEGRA) 180 MG tablet Take 180 mg by mouth daily.     fluticasone (FLONASE) 50 MCG/ACT nasal spray Place into both nostrils daily.     levonorgestrel (MIRENA) 20 MCG/24HR IUD 1 each by Intrauterine route once.     levothyroxine (SYNTHROID) 100 MCG tablet Take 100 mcg by mouth daily before breakfast.     Multiple Vitamins-Minerals (MULTIVITAMIN WITH MINERALS) tablet Take 1 tablet by mouth daily.       Polyethylene Glycol 3350 (MIRALAX PO) Take by mouth.     prochlorperazine (COMPAZINE) 10 MG tablet Take 1 tablet (10 mg total) by mouth every 6 (six) hours as needed for nausea or vomiting. 30 tablet 11   topiramate (TOPAMAX) 100 MG tablet Take 1 tablet (100 mg total) by mouth at bedtime. 90 tablet 3   No current facility-administered medications for this visit.    Allergies as of 02/25/2021 - Review Complete 05/29/2020  Allergen Reaction Noted   Ceclor [cefaclor] Hives 02/26/2011    Vitals: There were no vitals taken for this visit. Last Weight:  Wt Readings from Last 1 Encounters:  05/29/20 107 lb (48.5 kg)   Last Height:   Ht Readings from Last 1 Encounters:  05/29/20 5\' 4"  (1.626 m)     Physical exam: Exam: Gen: NAD, conversant      CV: attempted, Could not perform over Web Video. Denies palpitations or chest pain or SOB. VS: Breathing at a normal rate. Weight appears within  normal limits. Not febrile. Eyes: Conjunctivae clear without exudates or hemorrhage  Neuro: Detailed Neurologic Exam  Speech:  Speech is normal; fluent and spontaneous with normal comprehension.  Cognition:    The patient is oriented to person, place, and time;     recent and remote memory intact;     language fluent;     normal attention, concentration,     fund of knowledge Cranial Nerves:    The pupils are equal, round, and reactive to light. Attempted, Cannot perform fundoscopic exam. Visual fields are full to finger confrontation. Extraocular movements are intact.  The face is symmetric with normal sensation. The palate elevates in the midline. Hearing intact. Voice is normal. Shoulder shrug is normal. The tongue has normal motion without fasciculations.   Coordination:    Normal finger to nose  Gait:    Normal native gait  Motor Observation:   no involuntary movements noted. Tone:    Appears normal  Posture:    Posture is normal. normal erect    Strength:    Strength is anti-gravity and symmetric in the upper and lower limbs.      Sensation: intact to LT     Reflex Exam:  DTR's:    Attempted, Could not perform over Web Video   Toes: Attempted Could not perform over Web Video  Clonus:   Attempted, Could not perform over Web Video     Assessment/Plan: This is a 35 year old patient with migraines. Episodic migraines. Worsening, Topamax no longer helping.  -Start Qulipta 60mg  daily In about 2 weeks decrease Topiramate by 50% and then even stop it a few weeks after that -Change Naratriptan to Rizatriptan: Please take one tablet at the onset of your headache. If it does not improve the symptoms please take one additional tablet. Do not take more then 2 tablets in 24hrs. Do not take use more then 2 to 3 times in a week. -Increase zofran(ndansetron) to 8mg  (consider called Reglan) -Try Compazine(prochloperazine) for the dizziness/nausea, can take it with the  Rizatriptan    - Labs: she sees an endocrinologist and he follows her thyroid.   - I emailed patient an overview of migraines and migraine management in the past. In past gave info on vestibular symptoms in migraines.  Meds ordered this encounter  Medications   ondansetron (ZOFRAN) 8 MG tablet    Sig: Take 1 tablet (8 mg total) by mouth every 8 (eight) hours as needed for nausea or vomiting.    Dispense:  30 tablet    Refill:  11   rizatriptan (MAXALT-MLT) 10 MG disintegrating tablet    Sig: Take 1 tablet (10 mg total) by mouth as needed for migraine. May repeat in 2 hours if needed    Dispense:  9 tablet    Refill:  11   prochlorperazine (COMPAZINE) 10 MG tablet    Sig: Take 1 tablet (10 mg total) by mouth every 6 (six) hours as needed for nausea or vomiting.    Dispense:  30 tablet    Refill:  11     Discussed: To prevent or relieve headaches, try the following: Cool Compress. Lie down and place a cool compress on your head.  Avoid headache triggers. If certain foods or odors seem to have triggered your migraines in the past, avoid them. A headache diary might help you identify triggers.  Include physical activity in your daily routine. Try a daily walk or other moderate aerobic exercise.  Manage stress. Find healthy ways to cope with the stressors, such as delegating tasks on your to-do list.  Practice relaxation techniques. Try deep breathing,  yoga, massage and visualization.  Eat regularly. Eating regularly scheduled meals and maintaining a healthy diet might help prevent headaches. Also, drink plenty of fluids.  Follow a regular sleep schedule. Sleep deprivation might contribute to headaches Consider biofeedback. With this mind-body technique, you learn to control certain bodily functions -- such as muscle tension, heart rate and blood pressure -- to prevent headaches or reduce headache pain.    Proceed to emergency room if you experience new or worsening symptoms or symptoms  do not resolve, if you have new neurologic symptoms or if headache is severe, or for any concerning symptom.   Provided education and documentation from American headache Society toolbox including articles on: chronic migraine medication overuse headache, chronic migraines, prevention of migraines, behavioral and other nonpharmacologic treatments for headache.   Sarina Ill, MD  [facility] [address] [place], [place] [address]  Phone [phone] Fax [phone]  I spent 15 minutes of face-to-face and non-face-to-face time with patient on the  No diagnosis found.  diagnosis.  This included previsit chart review, lab review, study review, order entry, electronic health record documentation, patient education on the different diagnostic and therapeutic options, counseling and coordination of care, risks and benefits of management, compliance, or risk factor reduction

## 2021-02-25 NOTE — Patient Instructions (Addendum)
Start Qulipta 60mg  daily In about 2 weeks decrease Topiramate by 50% and then even stop it a few weeks after that Change Naratriptan to Rizatriptan: Please take one tablet at the onset of your headache. If it does not improve the symptoms please take one additional tablet. Do not take more then 2 tablets in 24hrs. Do not take use more then 2 to 3 times in a week. Increase zofran(ndansetron) to 8mg  (consider called Reglan) Try Compazine(prochloperazine) for the dizziness/nausea, can take it with the Rizatriptan  Atogepant tablets What is this medication? ATOGEPANT (a TOE je pant) is used to prevent migraine headaches. This medicine may be used for other purposes; ask your health care provider orpharmacist if you have questions. COMMON BRAND NAME(S): QULIPTA What should I tell my care team before I take this medication? They need to know if you have any of these conditions: kidney disease liver disease an unusual or allergic reaction to atogepant, other medicines, foods, dyes, or preservatives pregnant or trying to get pregnant breast-feeding How should I use this medication? Take this medicine by mouth with water. Take it as directed on the prescription label at the same time every day. You can take it with or without food. If it upsets your stomach, take it with food. Keep taking it unless your health careprovider tells you to stop. Talk to your health care provider about the use of this medicine in children.Special care may be needed. Overdosage: If you think you have taken too much of this medicine contact apoison control center or emergency room at once. NOTE: This medicine is only for you. Do not share this medicine with others. What if I miss a dose? If you miss a dose, take it as soon as you can. If it is almost time for yournext dose, take only that dose. Do not take double or extra doses. What may interact with this medication? carbamazepine certain medicines for fungal infections  like itraconazole, ketoconazole clarithromycin cyclosporine efavirenz etravirine phenytoin rifampin St. John's Wort This list may not describe all possible interactions. Give your health care provider a list of all the medicines, herbs, non-prescription drugs, or dietary supplements you use. Also tell them if you smoke, drink alcohol, or use illegaldrugs. Some items may interact with your medicine. What should I watch for while using this medication? Visit your health care provider for regular checks on your progress. Tell your health care provider if your symptoms do not start to get better or if they getworse. What side effects may I notice from receiving this medication? Side effects that you should report to your doctor or health care provider assoon as possible: allergic reactions (skin rash, itching or hives; swelling of the face, lips, tongue) light-colored stool liver injury (dark yellow or brown urine; general ill feeling or flu-like symptoms; loss of appetite, right upper belly pain; unusually weak or tired, yellowing of the eyes or skin) Side effects that usually do not require medical attention (report these toyour doctor or health care provider if they continue or are bothersome): constipation lack or loss of appetite nausea unusually weak or tired weight loss This list may not describe all possible side effects. Call your doctor for medical advice about side effects. You may report side effects to FDA at1-800-FDA-1088. Where should I keep my medication? Keep out of the reach of children and pets. Store at room temperature between 20 and 25 degrees C (68 and 77 degrees F).Get rid of any unused medicine after the expiration date.  To get rid of medicines that are no longer needed or have expired: Take the medicine to a medicine take-back program. Check with your pharmacy or law enforcement to find a location. If you cannot return the medicine, check the label or package insert  to see if the medicine should be thrown out in the garbage or flushed down the toilet. If you are not sure, ask your health care provider. If it is safe to put it in the trash, take the medicine out of the container. Mix the medicine with cat litter, dirt, coffee grounds, or other unwanted substance. Seal the mixture in a bag or container. Put it in the trash. NOTE: This sheet is a summary. It may not cover all possible information. If you have questions about this medicine, talk to your doctor, pharmacist, orhealth care provider.  2022 Elsevier/Gold Standard (2020-05-09 11:20:03)  Ondansetron Dissolving Tablets What is this medication? ONDANSETRON (on DAN se tron) prevents nausea and vomiting from chemotherapy, radiation, or surgery. It works by blocking substances in the body that may cause nausea or vomiting. It belongs to a group of medications calledantiemetics. This medicine may be used for other purposes; ask your health care provider orpharmacist if you have questions. COMMON BRAND NAME(S): Zofran ODT What should I tell my care team before I take this medication? They need to know if you have any of these conditions: Heart disease History of irregular heartbeat Liver disease Low levels of magnesium or potassium in the blood An unusual or allergic reaction to ondansetron, granisetron, other medications, foods, dyes, or preservatives Pregnant or trying to get pregnant Breast-feeding How should I use this medication? These tablets are made to dissolve in the mouth. Do not try to push the tablet through the foil backing. With dry hands, peel away the foil backing and gently remove the tablet. Place the tablet in the mouth and allow it to dissolve, then swallow. While you may take these tablets with water, it is not necessary to doso. Talk to your care team regarding the use of this medication in children.Special care may be needed. Overdosage: If you think you have taken too much of this  medicine contact apoison control center or emergency room at once. NOTE: This medicine is only for you. Do not share this medicine with others. What if I miss a dose? If you miss a dose, take it as soon as you can. If it is almost time for yournext dose, take only that dose. Do not take double or extra doses. What may interact with this medication? Do not take this medication with any of the following: Apomorphine Certain medications for fungal infections like fluconazole, itraconazole, ketoconazole, posaconazole, voriconazole Cisapride Dronedarone Pimozide Thioridazine This medication may also interact with the following: Carbamazepine Certain medications for depression, anxiety, or psychotic disturbances Fentanyl Linezolid MAOIs like Carbex, Eldepryl, Marplan, Nardil, and Parnate Methylene blue (injected into a vein) Other medications that prolong the QT interval (cause an abnormal heart rhythm) like dofetilide, ziprasidone Phenytoin Rifampicin Tramadol This list may not describe all possible interactions. Give your health care provider a list of all the medicines, herbs, non-prescription drugs, or dietary supplements you use. Also tell them if you smoke, drink alcohol, or use illegaldrugs. Some items may interact with your medicine. What should I watch for while using this medication? Check with your care team as soon as you can if you have any sign of anallergic reaction. What side effects may I notice from receiving this medication? Side effects  that you should report to your care team as soon as possible: Allergic reactions-skin rash, itching, hives, swelling of the face, lips, tongue, or throat Bowel blockage-stomach cramping, unable to have a bowel movement or pass gas, loss of appetite, vomiting Chest pain (angina)-pain, pressure, or tightness in the chest, neck, back, or arms Heart rhythm changes-fast or irregular heartbeat, dizziness, feeling faint or lightheaded, chest  pain, trouble breathing Irritability, confusion, fast or irregular heartbeat, muscle stiffness, twitching muscles, sweating, high fever, seizure, chills, vomiting, diarrhea, which may be signs of serotonin syndrome Side effects that usually do not require medical attention (report to your careteam if they continue or are bothersome): Constipation Diarrhea General discomfort and fatigue Headache This list may not describe all possible side effects. Call your doctor for medical advice about side effects. You may report side effects to FDA at1-800-FDA-1088. Where should I keep my medication? Keep out of the reach of children and pets. Store between 2 and 30 degrees C (36 and 86 degrees F). Throw away any unusedmedication after the expiration date. NOTE: This sheet is a summary. It may not cover all possible information. If you have questions about this medicine, talk to your doctor, pharmacist, orhealth care provider.  2022 Elsevier/Gold Standard (2020-08-16 14:39:19) Rizatriptan Disintegrating Tablets What is this medication? RIZATRIPTAN (rye za TRIP tan) treats migraines. It works by blocking pain signals and narrowing blood vessels in the brain. It belongs to a group ofmedications called triptans. It is not used to prevent migraines. This medicine may be used for other purposes; ask your health care provider orpharmacist if you have questions. COMMON BRAND NAME(S): Maxalt-MLT What should I tell my care team before I take this medication? They need to know if you have any of these conditions: Cigarette smoker Circulation problems in fingers and toes Diabetes Heart disease High blood pressure High cholesterol History of irregular heartbeat History of stroke Kidney disease Liver disease Stomach or intestine problems An unusual or allergic reaction to rizatriptan, other medications, foods, dyes, or preservatives Pregnant or trying to get pregnant Breast-feeding How should I use this  medication? Take this medication by mouth. Follow the directions on the prescription label. Leave the tablet in the sealed blister pack until you are ready to take it. With dry hands, open the blister and gently remove the tablet. If the tablet breaks or crumbles, throw it away and take a new tablet out of the blister pack. Place the tablet in the mouth and allow it to dissolve, and then swallow. Do not cut, crush, or chew this medication. You do not need water to take thismedication. Do not take it more often than directed. Talk to your care team regarding the use of this medication in children. While this medication may be prescribed for children as young as 6 years for selectedconditions, precautions do apply. Overdosage: If you think you have taken too much of this medicine contact apoison control center or emergency room at once. NOTE: This medicine is only for you. Do not share this medicine with others. What if I miss a dose? This does not apply. This medication is not for regular use. What may interact with this medication? Do not take this medication with any of the following medications: Certain medications for migraine headache like almotriptan, eletriptan, frovatriptan, naratriptan, rizatriptan, sumatriptan, zolmitriptan Ergot alkaloids like dihydroergotamine, ergonovine, ergotamine, methylergonovine MAOIs like Carbex, Eldepryl, Marplan, Nardil, and Parnate This medication may also interact with the following medications: Certain medications for depression, anxiety, or psychotic disorders  Propranolol This list may not describe all possible interactions. Give your health care provider a list of all the medicines, herbs, non-prescription drugs, or dietary supplements you use. Also tell them if you smoke, drink alcohol, or use illegaldrugs. Some items may interact with your medicine. What should I watch for while using this medication? Visit your care team for regular checks on your  progress. Tell your care teamif your symptoms do not start to get better or if they get worse. You may get drowsy or dizzy. Do not drive, use machinery, or do anything that needs mental alertness until you know how this medication affects you. Do not stand up or sit up quickly, especially if you are an older patient. This reduces the risk of dizzy or fainting spells. Alcohol may interfere with theeffect of this medication. Your mouth may get dry. Chewing sugarless gum or sucking hard candy and drinking plenty of water may help. Contact your care team if the problem doesnot go away or is severe. If you take migraine medications for 10 or more days a month, your migraines may get worse. Keep a diary of headache days and medication use. Contact yourcare team if your migraine attacks occur more frequently. What side effects may I notice from receiving this medication? Side effects that you should report to your care team as soon as possible: Allergic reactions-skin rash, itching, hives, swelling of the face, lips, tongue, or throat Burning, pain, tingling, or color changes in the legs or feet Heart attack-pain or tightness in the chest, shoulders, arms, or jaw, nausea, shortness of breath, cold or clammy skin, feeling faint or lightheaded Heart rhythm changes-fast or irregular heartbeat, dizziness, feeling faint or lightheaded, chest pain, trouble breathing Increase in blood pressure Irritability, confusion, fast or irregular heartbeat, muscle stiffness, twitching muscles, sweating, high fever, seizure, chills, vomiting, diarrhea, which may be signs of serotonin syndrome Raynaud's-cool, numb, or painful fingers or toes that may change color from pale, to blue, to red Seizures Stroke-sudden numbness or weakness of the face, arm, or leg, trouble speaking, confusion, trouble walking, loss of balance or coordination, dizziness, severe headache, change in vision Sudden or severe stomach pain, nausea, vomiting,  fever, or bloody diarrhea Vision loss Side effects that usually do not require medical attention (report to your careteam if they continue or are bothersome): Dizziness General discomfort or fatigue This list may not describe all possible side effects. Call your doctor for medical advice about side effects. You may report side effects to FDA at1-800-FDA-1088. Where should I keep my medication? Keep out of the reach of children and pets. Store at room temperature between 15 and 30 degrees C (59 and 86 degrees F). Protect from light and moisture. Throw away any unused medication after theexpiration date. NOTE: This sheet is a summary. It may not cover all possible information. If you have questions about this medicine, talk to your doctor, pharmacist, orhealth care provider.  2022 Elsevier/Gold Standard (2020-08-21 16:19:19) Atogepant tablets What is this medication? ATOGEPANT (a TOE je pant) is used to prevent migraine headaches. This medicine may be used for other purposes; ask your health care provider orpharmacist if you have questions. COMMON BRAND NAME(S): QULIPTA What should I tell my care team before I take this medication? They need to know if you have any of these conditions: kidney disease liver disease an unusual or allergic reaction to atogepant, other medicines, foods, dyes, or preservatives pregnant or trying to get pregnant breast-feeding How should I use this medication?  Take this medicine by mouth with water. Take it as directed on the prescription label at the same time every day. You can take it with or without food. If it upsets your stomach, take it with food. Keep taking it unless your health careprovider tells you to stop. Talk to your health care provider about the use of this medicine in children.Special care may be needed. Overdosage: If you think you have taken too much of this medicine contact apoison control center or emergency room at once. NOTE: This medicine  is only for you. Do not share this medicine with others. What if I miss a dose? If you miss a dose, take it as soon as you can. If it is almost time for yournext dose, take only that dose. Do not take double or extra doses. What may interact with this medication? carbamazepine certain medicines for fungal infections like itraconazole, ketoconazole clarithromycin cyclosporine efavirenz etravirine phenytoin rifampin St. John's Wort This list may not describe all possible interactions. Give your health care provider a list of all the medicines, herbs, non-prescription drugs, or dietary supplements you use. Also tell them if you smoke, drink alcohol, or use illegaldrugs. Some items may interact with your medicine. What should I watch for while using this medication? Visit your health care provider for regular checks on your progress. Tell your health care provider if your symptoms do not start to get better or if they getworse. What side effects may I notice from receiving this medication? Side effects that you should report to your doctor or health care provider assoon as possible: allergic reactions (skin rash, itching or hives; swelling of the face, lips, tongue) light-colored stool liver injury (dark yellow or brown urine; general ill feeling or flu-like symptoms; loss of appetite, right upper belly pain; unusually weak or tired, yellowing of the eyes or skin) Side effects that usually do not require medical attention (report these toyour doctor or health care provider if they continue or are bothersome): constipation lack or loss of appetite nausea unusually weak or tired weight loss This list may not describe all possible side effects. Call your doctor for medical advice about side effects. You may report side effects to FDA at1-800-FDA-1088. Where should I keep my medication? Keep out of the reach of children and pets. Store at room temperature between 20 and 25 degrees C (68 and 77  degrees F).Get rid of any unused medicine after the expiration date. To get rid of medicines that are no longer needed or have expired: Take the medicine to a medicine take-back program. Check with your pharmacy or law enforcement to find a location. If you cannot return the medicine, check the label or package insert to see if the medicine should be thrown out in the garbage or flushed down the toilet. If you are not sure, ask your health care provider. If it is safe to put it in the trash, take the medicine out of the container. Mix the medicine with cat litter, dirt, coffee grounds, or other unwanted substance. Seal the mixture in a bag or container. Put it in the trash. NOTE: This sheet is a summary. It may not cover all possible information. If you have questions about this medicine, talk to your doctor, pharmacist, orhealth care provider.  2022 Elsevier/Gold Standard (2020-05-09 11:20:03)

## 2021-02-25 NOTE — Telephone Encounter (Signed)
Pt scheduled for VV 11/23 at 3:30 pm.

## 2021-02-25 NOTE — Telephone Encounter (Signed)
Would one of you mind scheduling her for a 4 month video f/u with me please? thanks

## 2021-03-03 ENCOUNTER — Telehealth: Payer: Self-pay | Admitting: *Deleted

## 2021-03-03 NOTE — Telephone Encounter (Signed)
Completed Qulipta PA on Cover My Meds. Key: [id number] - Rx #[id number]. Awaiting determination from [facility].

## 2021-03-04 DIAGNOSIS — Z0000 Encounter for general adult medical examination without abnormal findings: Secondary | ICD-10-CM | POA: Diagnosis not present

## 2021-03-05 NOTE — Telephone Encounter (Signed)
Per cover my meds,   Approvedon July 26 Effective from 03/03/2021 through 05/25/2021.  Faxed notification to pharmacy. Notified patient.

## 2021-03-06 DIAGNOSIS — U071 COVID-19: Secondary | ICD-10-CM | POA: Diagnosis not present

## 2021-03-06 DIAGNOSIS — Z20822 Contact with and (suspected) exposure to covid-19: Secondary | ICD-10-CM | POA: Diagnosis not present

## 2021-03-11 ENCOUNTER — Ambulatory Visit: Payer: BC Managed Care – PPO | Admitting: Obstetrics and Gynecology

## 2021-04-02 NOTE — Progress Notes (Signed)
35 y.o. G0P0000 Married White or Caucasian Not Hispanic or Latino female here for annual exam.  She has a mirena IUD, placed in 5/21. No regular cycles, just occasional light bleeding. No dyspareunia.    H/O lichen sclerosis, she has occasional flares. Uses steroid ointment prn. She will call if she needs a refill.   She see's Neurology for Migraines, recent change in medication.   She has IBS-C.   No LMP recorded. (Menstrual status: IUD).          Sexually active: Yes.    The current method of family planning is IUD.    Exercising: Yes.     Running  Smoker:  no  Health Maintenance: Pap:  08/09/18 Normal HPV Neg, 2017 per patient  History of abnormal Pap:  no MMG:  none  BMD:   none  Colonoscopy: none  TDaP:  01/08/18  Gardasil: complete    reports that she has never smoked. She has never used smokeless tobacco. She reports that she does not drink alcohol and does not use drugs. 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     Past Medical History:  Diagnosis Date   Broken finger    left pinky   IBS (irritable bowel syndrome)    Migraine without aura    Neuroma    Thyroid disease     Past Surgical History:  Procedure Laterality Date   COLONOSCOPY     FINGER FRACTURE SURGERY Right    5th finger   WISDOM TOOTH EXTRACTION      Current Outpatient Medications  Medication Sig Dispense Refill   Atogepant (QULIPTA) 60 MG TABS Take 60 mg by mouth daily. 30 tablet 11   clobetasol ointment (TEMOVATE) 0.05 % Apply pea size amount to vulva 1-2 x a day for up to 2 weeks as needed. Not for daily Sarti term use. 60 g 0   fexofenadine (ALLEGRA) 180 MG tablet Take 180 mg by mouth daily.     fluticasone (FLONASE) 50 MCG/ACT nasal spray Place into both nostrils daily.     levonorgestrel (MIRENA) 20 MCG/24HR IUD 1 each by Intrauterine route once.     levothyroxine (SYNTHROID) 100 MCG tablet Take 100 mcg by mouth daily before breakfast.      Multiple Vitamins-Minerals (MULTIVITAMIN WITH MINERALS) tablet Take 1 tablet by mouth daily.       naratriptan (AMERGE) 2.5 MG tablet take one tablet as needed     ondansetron (ZOFRAN) 8 MG tablet Take 1 tablet (8 mg total) by mouth every 8 (eight) hours as needed for nausea or vomiting. 30 tablet 11   Polyethylene Glycol 3350 (MIRALAX PO) Take by mouth.     prochlorperazine (COMPAZINE) 10 MG tablet Take 1 tablet (10 mg total) by mouth every 6 (six) hours as needed for nausea or vomiting. 30 tablet 11   topiramate (TOPAMAX) 100 MG tablet Take 1 tablet (100 mg total) by mouth at bedtime. 90 tablet 3   No current facility-administered medications for this visit.    Family History  Problem Relation Age of Onset   Cervical cancer Mother    Hypertension Father    Thyroid disease Father        hypothyroid   Thyroid disease Sister        hypothyroid   Thyroid disease Maternal Aunt        hypothyroid   Skin cancer Maternal Grandmother    Colon cancer Paternal Grandfather  Migraines Neg Hx     Review of Systems  All other systems reviewed and are negative.  Exam:   BP 110/70   Pulse (!) 49   Ht '5\' 4"'$  (1.626 m)   Wt 111 lb (50.3 kg)   SpO2 100%   BMI 19.05 kg/m   Weight change: '@WEIGHTCHANGE'$ @ Height:   Height: '5\' 4"'$  (162.6 cm)  Ht Readings from Last 3 Encounters:  04/04/21 '5\' 4"'$  (1.626 m)  05/29/20 '5\' 4"'$  (1.626 m)  02/16/20 '5\' 4"'$  (1.626 m)    General appearance: alert, cooperative and appears stated age Head: Normocephalic, without obvious abnormality, atraumatic Neck: no adenopathy, supple, symmetrical, trachea midline and thyroid normal to inspection and palpation Lungs: clear to auscultation bilaterally Cardiovascular: regular rate and rhythm Breasts: normal appearance, no masses or tenderness Abdomen: soft, non-tender; non distended,  no masses,  no organomegaly Extremities: extremities normal, atraumatic, no cyanosis or edema Skin: Skin color, texture, turgor normal. No  rashes or lesions Lymph nodes: Cervical, supraclavicular, and axillary nodes normal. No abnormal inguinal nodes palpated Neurologic: Grossly normal   Pelvic: External genitalia:  no lesions              Urethra:  normal appearing urethra with no masses, tenderness or lesions              Bartholins and Skenes: normal                 Vagina: normal appearing vagina with normal color and discharge, no lesions              Cervix: no lesions and IUD strings not seen, unable to tease them out of the cervix with a cytobrush               Bimanual Exam:  Uterus:  normal size, contour, position, consistency, mobility, non-tender and anteverted              Adnexa: no mass, fullness, tenderness               Rectovaginal: Confirms               Anus:  normal sphincter tone, no lesions  Gae Dry chaperoned for the exam.  1. Well woman exam No pap this year She will do her labs with her primary Discussed breast self exam Discussed calcium and vit D intake  2. IUD check up Doing well

## 2021-04-04 ENCOUNTER — Other Ambulatory Visit: Payer: Self-pay

## 2021-04-04 ENCOUNTER — Ambulatory Visit (INDEPENDENT_AMBULATORY_CARE_PROVIDER_SITE_OTHER): Payer: BC Managed Care – PPO | Admitting: Obstetrics and Gynecology

## 2021-04-04 ENCOUNTER — Encounter: Payer: Self-pay | Admitting: Obstetrics and Gynecology

## 2021-04-04 VITALS — BP 110/70 | HR 49 | Ht 64.0 in | Wt 111.0 lb

## 2021-04-04 DIAGNOSIS — Z30431 Encounter for routine checking of intrauterine contraceptive device: Secondary | ICD-10-CM

## 2021-04-04 DIAGNOSIS — Z01419 Encounter for gynecological examination (general) (routine) without abnormal findings: Secondary | ICD-10-CM

## 2021-04-04 DIAGNOSIS — T8332XA Displacement of intrauterine contraceptive device, initial encounter: Secondary | ICD-10-CM

## 2021-04-04 NOTE — Patient Instructions (Signed)

## 2021-04-10 ENCOUNTER — Other Ambulatory Visit: Payer: Self-pay

## 2021-04-10 ENCOUNTER — Ambulatory Visit (INDEPENDENT_AMBULATORY_CARE_PROVIDER_SITE_OTHER): Payer: BC Managed Care – PPO

## 2021-04-10 DIAGNOSIS — T8332XA Displacement of intrauterine contraceptive device, initial encounter: Secondary | ICD-10-CM

## 2021-05-09 DIAGNOSIS — L719 Rosacea, unspecified: Secondary | ICD-10-CM | POA: Diagnosis not present

## 2021-05-14 ENCOUNTER — Telehealth: Payer: Self-pay

## 2021-05-14 NOTE — Telephone Encounter (Signed)
Submitted a PA request on CMM, Key: [id number]. Awaiting determination from [facility]

## 2021-05-19 NOTE — Telephone Encounter (Signed)
Approved, effective from 05/14/2021 through 05/13/2022.

## 2021-05-26 ENCOUNTER — Telehealth: Payer: Self-pay | Admitting: Neurology

## 2021-05-26 NOTE — Telephone Encounter (Signed)
Pt called wanting to get in to see Dr. Jaynee Eagles sooner. Any appt suggestions?

## 2021-05-27 NOTE — Telephone Encounter (Signed)
I sent mychart message re: to requesting sooner appt.  I did place on the waitlist with Dr. Jaynee Eagles. She may also see NP sooner as well.  (Amy or Megan).

## 2021-05-28 ENCOUNTER — Telehealth: Payer: Self-pay | Admitting: *Deleted

## 2021-05-28 NOTE — Telephone Encounter (Signed)
I called pt and she will come in and see MM/NP tomorrow 05-29-21 at 0830 arrive 0815.  She appreciated call back.

## 2021-05-28 NOTE — Telephone Encounter (Signed)
Possible appt for pt mm 05/29/21 at 0830 or AL 05/29/21 at 1430.

## 2021-05-29 ENCOUNTER — Ambulatory Visit: Payer: BC Managed Care – PPO | Admitting: Adult Health

## 2021-05-29 ENCOUNTER — Encounter: Payer: Self-pay | Admitting: Adult Health

## 2021-05-29 VITALS — BP 116/70 | HR 58 | Ht 64.0 in | Wt 113.8 lb

## 2021-05-29 DIAGNOSIS — G43009 Migraine without aura, not intractable, without status migrainosus: Secondary | ICD-10-CM | POA: Diagnosis not present

## 2021-05-29 MED ORDER — AJOVY 225 MG/1.5ML ~~LOC~~ SOAJ: 225.0000 mg | SUBCUTANEOUS | 5 refills | Status: DC

## 2021-05-29 MED ORDER — NURTEC 75 MG PO TBDP: ORAL_TABLET | ORAL | 5 refills | Status: DC

## 2021-05-29 NOTE — Patient Instructions (Addendum)
Your Plan:  Start Ajovy  Stop Qulipta  Try nurtec for abortive therapy.   Thank you for coming to see [place] at [facility]. I hope we have been able to provide you high quality care today.  You may receive a patient satisfaction survey over the next few weeks. We would appreciate your feedback and comments so that we may continue to improve ourselves and the health of our patients.

## 2021-05-29 NOTE — Progress Notes (Signed)
PATIENT: Catherine Clay DOB: [birth year]  REASON FOR VISIT: follow up HISTORY FROM: patient PRIMARY NEUROLOGIST: Dr. Jaynee Eagles  HISTORY OF PRESENT ILLNESS: Today 05/29/21:  Catherine Clay is a 35 year old female with a history of migraine headaches.  She returns today for follow-up.  She states in the last month her headaches have increased in frequency.  She is currently on Trileptal.  She states initially her headaches were better.  She states that last week she had 3 headaches and so for this week she has had 2 headaches.  It typically start in the temporal region.  She reports nausea and vomiting if she does not take any abortive medication.  No change in her vision.  No numbness or tingling in the extremities.  She states that she is at home she will use an ice pack.  She was using Amerge for abortive therapy and felt that it offered her good benefit.  However that effect wore off and she was switched to rizatriptan.  She does not find that helpful.  States that her headaches can last up to 6 hours.  She has been on Topamax in the past.  She returns today for an evaluation.  HISTORY February 25, 2021: This is a patient with migraines, she has a lot of issues with motion that can trigger her migraines, we have tried naratriptan and Zofran when she is in a car, and IUD has helped with her migraines. She was having them twice a week. When she takes her preventative medications she needs more. Also ice packs. She takes a triptan and the zofran, it doesn't seem to take it away and makes it less but not completely. She still has to sit in a quiet room, it hit her at works and she had to put cold sodas on her head. They are more frequent and less responsive for 2 months. We increased her topiramate and it may not be helping. She is having 6 migraine days a month.    Medications tried that can be used in migraine management include Benadryl injection, Topamax, B complex, Toradol injection, Medrol Dosepak, Amerge,  Zofran, Imitrex, Compazine, Maxalt.  We increased her Topamax, she was worried about mental awareness, due to her recent migraines in the past several months increasing frequency, she has been taking her acute management medication to 1-2 times a week for several months which seems like a lot(6 migraines total).   Interval history 05/29/2020: She is doing well. Motion still triggers her migraines. More motion triggered. She can try the naratriptan in advance. IUD helped, will just refill the naratriptan. Try 1/2 dose of Topamax.    Interval history 05/29/2019: She is doing well. She is on Topamax 25mg , increase to 50mg . We discussed side effects. She is a Education officer, museum. Worried about the side effects. Email me if side effects and we can try something different. MRI was too expensive.    Interval history 02/21/2019: her migraines are more frequent in the last few months since being seen. The acute medication works but she is having migraine 2-4 times a week.  No inciting event. Maybe mask use a lot. Also she has a new job and she can't eat at her job and drink often which also may be the cause, she is working a summer job at [employer] and she only gets one 15-min break over 6 hours. She gets a migraine when she works but also being in a car triggers the migraines moreso if she is  a passenger but also when she is driving herself.    HPI:  Catherine Clay is a 35 y.o. female here as requested by No ref. provider found for migraines. Started about 3 years ago. Horrible headache in the temples, pressure, nausea, motion sickness, nothing helped (ibuprofen, benadryl), + vomiting. Happened twice a year after it was the worse headache for a week, couldn't move, couldn't read, couldn't look at a screen, blurred vision. She went to the ED and CT was negative of the head. Happened 2-3x that year. This past year in 2019 happened about the same time in September and has been worsening and becoming more frequent it occurred over  3 months from Sept-December. Motion sickness is worse, she feels sick in the car with a headache. It is pounding, pulsating, throbbing, bilaterally. She can't ride in a car without some queasiness and lasts for 20 minutes may not be followed by a headache.  She has blurred vision. Headaches are worse with exertion, valsalva, bending over. No morning or nocturnal headaches. No Fhx of migraines. No other focal neurologic deficits, associated symptoms, inciting events or modifiable factors.   Reviewed notes, labs and imaging from outside physicians, which showed:   Reviewed emergency room notes.  Patient was seen in the emergency room for migraines and dizziness, temporal, also generalized fatigue and intermittent dizziness.  Started with feeling intermittently dizzy with a temporal frontal migraine that gradually increased in severity.  She describes her dizziness as feeling seasick.  She had one episode of vomiting.  She has a history of fluid behind her ears and vertigo.  At that denied she denied any history of migraines.  She denied any photophobia, vision changes, fever, chest pain, shortness of breath or any other associated symptoms.  Ibuprofen and Tylenol did not help.  They noted a middle ear and fusion.  Also muscular tenderness in the neck.  Otherwise neurologic and physical exam were normal.  Her sodium was however 129.   Reviewed CT of the head report September 2018 which was normal except for mild mucosal thickening of the bilateral ethmoid sinuses.  REVIEW OF SYSTEMS: Out of a complete 14 system review of symptoms, the patient complains only of the following symptoms, and all other reviewed systems are negative.  ALLERGIES: Allergies  Allergen Reactions   Ceclor [Cefaclor] Hives    HOME MEDICATIONS: Outpatient Medications Prior to Visit  Medication Sig Dispense Refill   Atogepant (QULIPTA) 60 MG TABS Take 60 mg by mouth daily. 30 tablet 11   clobetasol ointment (TEMOVATE) 0.05 %  Apply pea size amount to vulva 1-2 x a day for up to 2 weeks as needed. Not for daily Corl term use. 60 g 0   fexofenadine (ALLEGRA) 180 MG tablet Take 180 mg by mouth daily.     fluticasone (FLONASE) 50 MCG/ACT nasal spray Place into both nostrils daily.     levonorgestrel (MIRENA) 20 MCG/24HR IUD 1 each by Intrauterine route once.     levothyroxine (SYNTHROID) 100 MCG tablet Take 100 mcg by mouth daily before breakfast.     Multiple Vitamins-Minerals (MULTIVITAMIN WITH MINERALS) tablet Take 1 tablet by mouth daily.       naratriptan (AMERGE) 2.5 MG tablet take one tablet as needed     ondansetron (ZOFRAN) 8 MG tablet Take 1 tablet (8 mg total) by mouth every 8 (eight) hours as needed for nausea or vomiting. 30 tablet 11   Polyethylene Glycol 3350 (MIRALAX PO) Take by mouth.  prochlorperazine (COMPAZINE) 10 MG tablet Take 1 tablet (10 mg total) by mouth every 6 (six) hours as needed for nausea or vomiting. 30 tablet 11   topiramate (TOPAMAX) 100 MG tablet Take 1 tablet (100 mg total) by mouth at bedtime. 90 tablet 3   No facility-administered medications prior to visit.    PAST MEDICAL HISTORY: Past Medical History:  Diagnosis Date   Broken finger    left pinky   IBS (irritable bowel syndrome)    Migraine without aura    Neuroma    Thyroid disease     PAST SURGICAL HISTORY: Past Surgical History:  Procedure Laterality Date   COLONOSCOPY     FINGER FRACTURE SURGERY Right    5th finger   WISDOM TOOTH EXTRACTION      FAMILY HISTORY: Family History  Problem Relation Age of Onset   Cervical cancer Mother    Hypertension Father    Thyroid disease Father        hypothyroid   Thyroid disease Sister        hypothyroid   Thyroid disease Maternal Aunt        hypothyroid   Skin cancer Maternal Grandmother    Colon cancer Paternal Grandfather    Migraines Neg Hx     SOCIAL HISTORY: Social History   Socioeconomic History   Marital status: Married    Spouse name: Not on  file   Number of children: 0   Years of education: Not on file   Highest education level: Bachelor's degree (e.g., BA, AB, BS)  Occupational History   Not on file  Tobacco Use   Smoking status: Never   Smokeless tobacco: Never  Vaping Use   Vaping Use: Never used  Substance and Sexual Activity   Alcohol use: No   Drug use: No   Sexual activity: Yes    Partners: Male    Birth control/protection: I.U.D.  Other Topics Concern   Not on file  Social History Narrative   Lives at home with her husband   Recently moved into a new home   Right handed    Caffeine: 2 cups of coffee daily    Social Determinants of Health   Financial Resource Strain: Not on file  Food Insecurity: Not on file  Transportation Needs: Not on file  Physical Activity: Not on file  Stress: Not on file  Social Connections: Not on file  Intimate Partner Violence: Not on file      PHYSICAL EXAM  Vitals:   05/29/21 0828  BP: 116/70  Pulse: (!) 58  Weight: 113 lb 12.8 oz (51.6 kg)  Height: 5\' 4"  (1.626 m)   Body mass index is 19.53 kg/m.    Generalized: Well developed, in no acute distress   Neurological examination  Mentation: Alert oriented to time, place, history taking. Follows all commands speech and language fluent Cranial nerve II-XII: Pupils were equal round reactive to light. Extraocular movements were full, visual field were full on confrontational test. Facial sensation and strength were normal. Uvula tongue midline. Head turning and shoulder shrug  were normal and symmetric. Motor: The motor testing reveals 5 over 5 strength of all 4 extremities. Good symmetric motor tone is noted throughout.  Sensory: Sensory testing is intact to soft touch on all 4 extremities. No evidence of extinction is noted.  Coordination: Cerebellar testing reveals good finger-nose-finger and heel-to-shin bilaterally.  Gait and station: Gait is normal.  Reflexes: Deep tendon reflexes are symmetric and normal  bilaterally.  DIAGNOSTIC DATA (LABS, IMAGING, TESTING) - I reviewed patient records, labs, notes, testing and imaging myself where available.  Lab Results  Component Value Date   WBC 5.9 12/15/2018   HGB 13.8 12/15/2018   HCT 40.3 12/15/2018   MCV 92 12/15/2018   PLT 286 12/15/2018      Component Value Date/Time   NA 137 12/15/2018 1546   K 4.3 12/15/2018 1546   CL 102 12/15/2018 1546   CO2 22 12/15/2018 1546   GLUCOSE 82 12/15/2018 1546   GLUCOSE 108 (H) 05/02/2017 1956   BUN 7 12/15/2018 1546   CREATININE 0.79 12/15/2018 1546   CALCIUM 9.5 12/15/2018 1546   PROT 7.4 12/15/2018 1546   ALBUMIN 4.2 12/15/2018 1546   AST 20 12/15/2018 1546   ALT 16 12/15/2018 1546   ALKPHOS 68 12/15/2018 1546   BILITOT 0.3 12/15/2018 1546   GFRNONAA 101 12/15/2018 1546   GFRAA 116 12/15/2018 1546    Lab Results  Component Value Date   TSH 3.289 02/26/2011      ASSESSMENT AND PLAN 35 y.o. year old female  has a past medical history of Broken finger, IBS (irritable bowel syndrome), Migraine without aura, Neuroma, and Thyroid disease. here with:  Migraine headaches  Stop [employer] -monthly injection for prevention Try Nurtec for abortive therapy 1 tablet in 24 hours Can use rizatriptan if needed for abortive therapy Follow-up in 3 to 4 months or sooner if needed     Ward Givens, MSN, NP-C 05/29/2021, 8:24 AM [facility] [address] [place], [place] [address] [phone]

## 2021-06-02 ENCOUNTER — Telehealth: Payer: Self-pay | Admitting: *Deleted

## 2021-06-02 ENCOUNTER — Other Ambulatory Visit: Payer: Self-pay | Admitting: Neurology

## 2021-06-02 MED ORDER — METHYLPREDNISOLONE 4 MG PO TBPK: ORAL_TABLET | ORAL | 0 refills | Status: DC

## 2021-06-02 NOTE — Telephone Encounter (Signed)
Per Dr Jaynee Eagles, ok to go ahead and place order for medrol dose pack. Pt last took this in November 2021.

## 2021-06-02 NOTE — Telephone Encounter (Signed)
Completed Ajovy PA on Cover My Meds. Key: [id number] - Rx #: [id number]. Awaiting determination from [place] within 72 hours.

## 2021-06-02 NOTE — Telephone Encounter (Signed)
Nurtec approved effective from 06/02/2021 through 08/24/2021.

## 2021-06-02 NOTE — Addendum Note (Signed)
Addended by: Gildardo Griffes on: 06/02/2021 11:59 AM   Modules accepted: Orders

## 2021-06-02 NOTE — Telephone Encounter (Signed)
Completed urgent nurtec PA on cover my meds. Key: [id number]. Awaiting determination from [place].

## 2021-06-02 NOTE — Telephone Encounter (Signed)
Received approval Effective from 06/02/2021 through 08/24/2021.

## 2021-06-16 DIAGNOSIS — Z79899 Other long term (current) drug therapy: Secondary | ICD-10-CM | POA: Diagnosis not present

## 2021-06-16 DIAGNOSIS — L719 Rosacea, unspecified: Secondary | ICD-10-CM | POA: Diagnosis not present

## 2021-06-16 DIAGNOSIS — R531 Weakness: Secondary | ICD-10-CM | POA: Diagnosis not present

## 2021-06-17 DIAGNOSIS — L700 Acne vulgaris: Secondary | ICD-10-CM | POA: Diagnosis not present

## 2021-07-02 ENCOUNTER — Telehealth: Payer: BC Managed Care – PPO | Admitting: Neurology

## 2021-07-18 DIAGNOSIS — L719 Rosacea, unspecified: Secondary | ICD-10-CM | POA: Diagnosis not present

## 2021-07-22 DIAGNOSIS — Z79899 Other long term (current) drug therapy: Secondary | ICD-10-CM | POA: Diagnosis not present

## 2021-07-22 DIAGNOSIS — R531 Weakness: Secondary | ICD-10-CM | POA: Diagnosis not present

## 2021-07-22 DIAGNOSIS — L700 Acne vulgaris: Secondary | ICD-10-CM | POA: Diagnosis not present

## 2021-08-18 ENCOUNTER — Telehealth: Payer: Self-pay

## 2021-08-18 DIAGNOSIS — L700 Acne vulgaris: Secondary | ICD-10-CM | POA: Diagnosis not present

## 2021-08-18 NOTE — Telephone Encounter (Signed)
I submitted a PA for Nurtec #15 on CMM, Key: [id number]. Awaiting determination from [place].

## 2021-08-21 NOTE — Telephone Encounter (Signed)
Received a fax from [employer].  Nurtec has been approved for acute use at 8 tablets/month, not 15. I spoke with [facility] and advised. I was told the pt recently filled on 08/17/21.

## 2021-09-02 NOTE — Telephone Encounter (Signed)
Pt said she just received the notification in the mail re: inform from entry on 08-21-21 @ 1:25.  Pt states she is pretty sure that she was getting more than 8 previously and not sure if it were only 15.  Pt is asking for a call to discuss an appeal on this, please call.

## 2021-09-03 NOTE — Telephone Encounter (Signed)
I see from [facility] last done Key: [id number] effective from 08/18/2021 through 08/17/2022. #15.  Pharmacy received 05/2021 #15, on 08-17-2021 received 8, QL #8 in 23 days.  Will need to call insurance.  No change in insurance per pharmacy.

## 2021-09-03 NOTE — Telephone Encounter (Signed)
[address] re: [place] [phone] LO.  Spoke to [place].  She was very helpful.  She said that there is a Quantity limit for nurtec for acute migraines of #8/30 days.  She did receive #15 last year in October.  This is now expired from 08-24-2021.  New CMM attempt did stated approval for #15, but there is the issue for diagnosis and QL and only #8 to be prescribed per 30 days.  She said no notes were faxed in for the PA.  Could try that and ask for a PCR (provider consideration review).  I faxed to [phone] with fax confirmation received, this would take up to 7 days.

## 2021-09-09 NOTE — Telephone Encounter (Signed)
Received determination and still came back as denied.

## 2021-09-11 MED ORDER — METHYLPREDNISOLONE 4 MG PO TBPK: ORAL_TABLET | ORAL | 0 refills | Status: DC

## 2021-09-11 NOTE — Telephone Encounter (Signed)
I called pt and she will come in 09-15-2021 at 1115 for check in with MM/NP for migraine med management.  I cancelled the 09-30-21 appt.

## 2021-09-11 NOTE — Addendum Note (Signed)
Addended by: Trudie Buckler on: 09/11/2021 10:12 AM   Modules accepted: Orders

## 2021-09-15 ENCOUNTER — Ambulatory Visit: Payer: BC Managed Care – PPO | Admitting: Adult Health

## 2021-09-18 DIAGNOSIS — L719 Rosacea, unspecified: Secondary | ICD-10-CM | POA: Diagnosis not present

## 2021-09-25 ENCOUNTER — Ambulatory Visit: Payer: BC Managed Care – PPO | Admitting: Adult Health

## 2021-09-25 ENCOUNTER — Encounter: Payer: Self-pay | Admitting: Adult Health

## 2021-09-25 VITALS — BP 116/77 | HR 64 | Ht 64.0 in | Wt 114.0 lb

## 2021-09-25 DIAGNOSIS — G43009 Migraine without aura, not intractable, without status migrainosus: Secondary | ICD-10-CM | POA: Diagnosis not present

## 2021-09-25 NOTE — Patient Instructions (Signed)
Your Plan:  Continue Ajovy Continue Nurtec for abortive therapy May consider Botox in the future If your symptoms worsen or you develop new symptoms please let us know.   Thank you for coming to see [place] at [facility]. I hope we have been able to provide you high quality care today.  You may receive a patient satisfaction survey over the next few weeks. We would appreciate your feedback and comments so that we may continue to improve ourselves and the health of our patients.

## 2021-09-25 NOTE — Progress Notes (Signed)
PATIENT: Catherine Clay DOB: December 07, 1985  REASON FOR VISIT: follow up HISTORY FROM: patient PRIMARY NEUROLOGIST: Dr. Jaynee Eagles  HISTORY OF PRESENT ILLNESS: Today 09/25/21:  [name] is a 36 year old female with a history of migraine headaches.  She returns today for follow-up.  She is currently on Ajovy and Nurtec.  She reports that up until January she was having maybe 1-2 headaches a week.  There was even some weeks she went without a headache.  In January her headaches increase to 3-4 times a week.  We called in a Medrol Dosepak and she reports that since she is taking that she is only had 1 headache since last week.  Reports that when she takes Nurtec her headache resolves fairly quickly.  However in January she ran out of her Nurtec  05/29/21: [name] is a 36 year old female with a history of migraine headaches.  She returns today for follow-up.  She states in the last month her headaches have increased in frequency.  She is currently on Trileptal.  She states initially her headaches were better.  She states that last week she had 3 headaches and so for this week she has had 2 headaches.  It typically start in the temporal region.  She reports nausea and vomiting if she does not take any abortive medication.  No change in her vision.  No numbness or tingling in the extremities.  She states that she is at home she will use an ice pack.  She was using Amerge for abortive therapy and felt that it offered her good benefit.  However that effect wore off and she was switched to rizatriptan.  She does not find that helpful.  States that her headaches can last up to 6 hours.  She has been on Topamax in the past.  She returns today for an evaluation.  HISTORY February 25, 2021: This is a patient with migraines, she has a lot of issues with motion that can trigger her migraines, we have tried naratriptan and Zofran when she is in a car, and IUD has helped with her migraines. She was having them twice a week.  When she takes her preventative medications she needs more. Also ice packs. She takes a triptan and the zofran, it doesn't seem to take it away and makes it less but not completely. She still has to sit in a quiet room, it hit her at works and she had to put cold sodas on her head. They are more frequent and less responsive for 2 months. We increased her topiramate and it may not be helping. She is having 6 migraine days a month.    Medications tried that can be used in migraine management include Benadryl injection, Topamax, B complex, Toradol injection, Medrol Dosepak, Amerge, Zofran, Imitrex, Compazine, Maxalt.  We increased her Topamax, she was worried about mental awareness, due to her recent migraines in the past several months increasing frequency, she has been taking her acute management medication to 1-2 times a week for several months which seems like a lot(6 migraines total).   Interval history 05/29/2020: She is doing well. Motion still triggers her migraines. More motion triggered. She can try the naratriptan in advance. IUD helped, will just refill the naratriptan. Try 1/2 dose of Topamax.    Interval history 05/29/2019: She is doing well. She is on Topamax 25mg , increase to 50mg . We discussed side effects. She is a Education officer, museum. Worried about the side effects. Email me if side effects  and we can try something different. MRI was too expensive.    Interval history 02/21/2019: her migraines are more frequent in the last few months since being seen. The acute medication works but she is having migraine 2-4 times a week.  No inciting event. Maybe mask use a lot. Also she has a new job and she can't eat at her job and drink often which also may be the cause, she is working a summer job at [employer] and she only gets one 15-min break over 6 hours. She gets a migraine when she works but also being in a car triggers the migraines moreso if she is a passenger but also when she is driving herself.     HPI:  Catherine Clay is a 36 y.o. female here as requested by No ref. provider found for migraines. Started about 3 years ago. Horrible headache in the temples, pressure, nausea, motion sickness, nothing helped (ibuprofen, benadryl), + vomiting. Happened twice a year after it was the worse headache for a week, couldn't move, couldn't read, couldn't look at a screen, blurred vision. She went to the ED and CT was negative of the head. Happened 2-3x that year. This past year in 2019 happened about the same time in September and has been worsening and becoming more frequent it occurred over 3 months from Sept-December. Motion sickness is worse, she feels sick in the car with a headache. It is pounding, pulsating, throbbing, bilaterally. She can't ride in a car without some queasiness and lasts for 20 minutes may not be followed by a headache.  She has blurred vision. Headaches are worse with exertion, valsalva, bending over. No morning or nocturnal headaches. No Fhx of migraines. No other focal neurologic deficits, associated symptoms, inciting events or modifiable factors.   Reviewed notes, labs and imaging from outside physicians, which showed:   Reviewed emergency room notes.  Patient was seen in the emergency room for migraines and dizziness, temporal, also generalized fatigue and intermittent dizziness.  Started with feeling intermittently dizzy with a temporal frontal migraine that gradually increased in severity.  She describes her dizziness as feeling seasick.  She had one episode of vomiting.  She has a history of fluid behind her ears and vertigo.  At that denied she denied any history of migraines.  She denied any photophobia, vision changes, fever, chest pain, shortness of breath or any other associated symptoms.  Ibuprofen and Tylenol did not help.  They noted a middle ear and fusion.  Also muscular tenderness in the neck.  Otherwise neurologic and physical exam were normal.  Her sodium was however  129.   Reviewed CT of the head report September 2018 which was normal except for mild mucosal thickening of the bilateral ethmoid sinuses.  REVIEW OF SYSTEMS: Out of a complete 14 system review of symptoms, the patient complains only of the following symptoms, and all other reviewed systems are negative.  ALLERGIES: Allergies  Allergen Reactions   Ceclor [Cefaclor] Hives    HOME MEDICATIONS: Outpatient Medications Prior to Visit  Medication Sig Dispense Refill   clobetasol ointment (TEMOVATE) 0.05 % Apply pea size amount to vulva 1-2 x a day for up to 2 weeks as needed. Not for daily Gude term use. 60 g 0   fexofenadine (ALLEGRA) 180 MG tablet Take 180 mg by mouth daily.     fluticasone (FLONASE) 50 MCG/ACT nasal spray Place into both nostrils daily.     Fremanezumab-vfrm (AJOVY) 225 MG/1.5ML SOAJ Inject 225  mg into the skin every 30 (thirty) days. 1.68 mL 5   levonorgestrel (MIRENA) 20 MCG/24HR IUD 1 each by Intrauterine route once.     levothyroxine (SYNTHROID) 100 MCG tablet Take 100 mcg by mouth daily before breakfast.     methylPREDNISolone (MEDROL DOSEPAK) 4 MG TBPK tablet follow package directions. Take with food. 21 tablet 0   Multiple Vitamins-Minerals (MULTIVITAMIN WITH MINERALS) tablet Take 1 tablet by mouth daily.       ondansetron (ZOFRAN) 8 MG tablet Take 1 tablet (8 mg total) by mouth every 8 (eight) hours as needed for nausea or vomiting. 30 tablet 11   Polyethylene Glycol 3350 (MIRALAX PO) Take by mouth.     prochlorperazine (COMPAZINE) 10 MG tablet Take 1 tablet (10 mg total) by mouth every 6 (six) hours as needed for nausea or vomiting. 30 tablet 11   Rimegepant Sulfate (NURTEC) 75 MG TBDP Take 1 tablet at the onset of migraine. Only 1 tab in 24 hours 15 tablet 5   rizatriptan (MAXALT-MLT) 10 MG disintegrating tablet Take 10 mg by mouth as needed.     topiramate (TOPAMAX) 100 MG tablet Take 1 tablet (100 mg total) by mouth at bedtime. 90 tablet 3   No  facility-administered medications prior to visit.    PAST MEDICAL HISTORY: Past Medical History:  Diagnosis Date   Broken finger    left pinky   IBS (irritable bowel syndrome)    Migraine without aura    Neuroma    Thyroid disease     PAST SURGICAL HISTORY: Past Surgical History:  Procedure Laterality Date   COLONOSCOPY     FINGER FRACTURE SURGERY Right    5th finger   WISDOM TOOTH EXTRACTION      FAMILY HISTORY: Family History  Problem Relation Age of Onset   Cervical cancer Mother    Hypertension Father    Thyroid disease Father        hypothyroid   Thyroid disease Sister        hypothyroid   Thyroid disease Maternal Aunt        hypothyroid   Migraines Paternal Aunt    Skin cancer Maternal Grandmother    Colon cancer Paternal Grandfather     SOCIAL HISTORY: Social History   Socioeconomic History   Marital status: Married    Spouse name: Not on file   Number of children: 0   Years of education: Not on file   Highest education level: Bachelor's degree (e.g., BA, AB, BS)  Occupational History   Not on file  Tobacco Use   Smoking status: Never   Smokeless tobacco: Never  Vaping Use   Vaping Use: Never used  Substance and Sexual Activity   Alcohol use: No   Drug use: No   Sexual activity: Yes    Partners: Male    Birth control/protection: I.U.D.  Other Topics Concern   Not on file  Social History Narrative   Lives at home with her husband   Recently moved into a new home   Right handed    Caffeine: 2 cups of coffee daily    Social Determinants of Health   Financial Resource Strain: Not on file  Food Insecurity: Not on file  Transportation Needs: Not on file  Physical Activity: Not on file  Stress: Not on file  Social Connections: Not on file  Intimate Partner Violence: Not on file      PHYSICAL EXAM  Vitals:   09/25/21 0739  BP: 116/77  Pulse: 64  Weight: 114 lb (51.7 kg)  Height: 5\' 4"  (1.626 m)   Body mass index is 19.53 kg/m.     Generalized: Well developed, in no acute distress   Neurological examination  Mentation: Alert oriented to time, place, history taking. Follows all commands speech and language fluent Cranial nerve II-XII: Pupils were equal round reactive to light. Extraocular movements were full, visual field were full on confrontational test. Facial sensation and strength were normal. Uvula tongue midline. Head turning and shoulder shrug  were normal and symmetric. Motor: The motor testing reveals 5 over 5 strength of all 4 extremities. Good symmetric motor tone is noted throughout.  Sensory: Sensory testing is intact to soft touch on all 4 extremities. No evidence of extinction is noted.  Coordination: Cerebellar testing reveals good finger-nose-finger and heel-to-shin bilaterally.  Gait and station: Gait is normal.  Reflexes: Deep tendon reflexes are symmetric and normal bilaterally.   DIAGNOSTIC DATA (LABS, IMAGING, TESTING) - I reviewed patient records, labs, notes, testing and imaging myself where available.  Lab Results  Component Value Date   WBC 5.9 12/15/2018   HGB 13.8 12/15/2018   HCT 40.3 12/15/2018   MCV 92 12/15/2018   PLT 286 12/15/2018      Component Value Date/Time   NA 137 12/15/2018 1546   K 4.3 12/15/2018 1546   CL 102 12/15/2018 1546   CO2 22 12/15/2018 1546   GLUCOSE 82 12/15/2018 1546   GLUCOSE 108 (H) 05/02/2017 1956   BUN 7 12/15/2018 1546   CREATININE 0.79 12/15/2018 1546   CALCIUM 9.5 12/15/2018 1546   PROT 7.4 12/15/2018 1546   ALBUMIN 4.2 12/15/2018 1546   AST 20 12/15/2018 1546   ALT 16 12/15/2018 1546   ALKPHOS 68 12/15/2018 1546   BILITOT 0.3 12/15/2018 1546   GFRNONAA 101 12/15/2018 1546   GFRAA 116 12/15/2018 1546    Lab Results  Component Value Date   TSH 3.289 02/26/2011      ASSESSMENT AND PLAN 36 y.o. year old female  has a past medical history of Broken finger, IBS (irritable bowel syndrome), Migraine without aura, Neuroma, and Thyroid  disease. here with:  Migraine headaches  Continue Ajovy -monthly injection for prevention Continue Nurtec for abortive therapy 1 tablet in 24 hours Advised that if her headache frequency increases again we will consider switching her to Botox therapy Discussed in depth Botox procedure and side effects Follow-up in 1 year or sooner if needed     Ward Givens, MSN, NP-C 09/25/2021, 7:42 AM [facility] [address] [place], [place] [address] [phone]

## 2021-09-30 ENCOUNTER — Ambulatory Visit: Payer: BC Managed Care – PPO | Admitting: Adult Health

## 2021-10-13 ENCOUNTER — Telehealth: Payer: Self-pay | Admitting: Adult Health

## 2021-10-13 NOTE — Telephone Encounter (Signed)
Pt said, pharmacy instructed to call [place] for the status on Nurtec being submitted to insurance. Would like a call from the nurse. ?

## 2021-10-13 NOTE — Telephone Encounter (Signed)
Spoke to patient still having headaches after using her Nurtec . Pt is currently out of Nurtec and Already took Angels for the month. Pt states she needs something else for her breakthrough migraine/headache. Pt wants to discuss possible Botox . Was discussed in her last office visit in Feb 2023 .forward to Megan,NP  for  Further Advise ?

## 2021-10-13 NOTE — Telephone Encounter (Signed)
Pt has called Naaman Plummer, CMA back, please call pt. ?

## 2021-10-13 NOTE — Telephone Encounter (Signed)
?  Received a fax from [employer].  Nurtec has been approved for acute use at 8 tablets/month, not 15. [facility] and advised. I was told the pt recently filled on 08/17/21.  ?This approval was given beginning of January; for # 8 unable to get # 15 approved. I have sent my chart message to the pt.  ?

## 2021-10-13 NOTE — Telephone Encounter (Signed)
At 10:18 pt left vm stating since last Monday she has had a migraine and would like a call to discuss.  ?

## 2021-10-13 NOTE — Telephone Encounter (Signed)
Returned patients call LVM for patient to give me a call back.  3:01pm on 10/13/2021  ?

## 2021-10-13 NOTE — Telephone Encounter (Signed)
Returned Patients call LVM for patient to call me back  ?

## 2021-10-14 NOTE — Telephone Encounter (Signed)
POD 4: Please let patient know that we will proceed with getting her transition to Botox.  For rescue therapy it looks like she is tried multiple medications in the past.  Please ask the patient if she has tried something in the past that worked fairly well for her if so we can retry it if not advised her that we could try zipsor which is diclofenac and see if that will help her headaches. ? ?Rodman Pickle: Please start the process get her approved for Botox she will need to sign forms ?

## 2021-10-15 ENCOUNTER — Telehealth: Payer: Self-pay

## 2021-10-15 ENCOUNTER — Telehealth: Payer: Self-pay | Admitting: Adult Health

## 2021-10-15 NOTE — Telephone Encounter (Signed)
PA for nurtec has been sent  ?(Key: [id number]) ? ?Your information has been submitted to [place]. [employer] [place] will review the request and notify you of the determination decision directly, typically within 72 hours of receiving all information. ? ?You will also receive your request decision electronically. To check for an update later, open this request again from your dashboard. ? ?If [employer] [place] has not responded within the specified timeframe or if you have any questions about your PA submission, contact [place] directly at [phone]. ?

## 2021-10-15 NOTE — Telephone Encounter (Signed)
Completed [employer] Botox PA form, placed in Nurse Pod for MD signature. ?

## 2021-10-15 NOTE — Telephone Encounter (Signed)
Zipsor may be a good alternative as [place] as she does not have any GI issues.  Ideally if she is going through her Nurtec then her preventative medicine is not working therefore switching over to Botox may be helpful.  In the interim she may want to try Zipsor since it will be another 20 days before Nurtec will be refilled.  I would encourage her to read over [place] and let us know if she has any questions or concerns before we prescribe it ?

## 2021-10-15 NOTE — Telephone Encounter (Signed)
Called [employer] to check if CPT code [phone] and [phone] require PA spoke with Angela Nevin [phone]) she states PA is required. ?

## 2021-10-16 NOTE — Telephone Encounter (Signed)
Faxed signed PA form with OV notes to [facility] @ [phone]. ? ?

## 2021-10-20 ENCOUNTER — Encounter: Payer: Self-pay | Admitting: *Deleted

## 2021-10-20 DIAGNOSIS — L700 Acne vulgaris: Secondary | ICD-10-CM | POA: Diagnosis not present

## 2021-10-20 NOTE — Telephone Encounter (Signed)
Appeal requesting 15-16 tablets of Nurtec per month faxed to [employer]. [phone].  ?

## 2021-10-20 NOTE — Telephone Encounter (Signed)
I already sent a message to get patient started on Botox. ? ?Catherine Clay-- Status?  Please get her worked in [place] ? ?POD 4: I mentioned Zipsor to the patient as a abortive therapy however she has IBS and this would be contraindicated.  I think getting her started on Botox is probably her best option. ?

## 2021-10-20 NOTE — Telephone Encounter (Signed)
Message sent back to pt in other encounter where we discussing Zipsor.  ?

## 2021-10-20 NOTE — Telephone Encounter (Signed)
Message from [place] NP:  ? ?I mentioned Zipsor to the patient as a abortive therapy however she has IBS and this would be contraindicated.  I think getting her started on Botox is probably her best option. ?

## 2021-10-21 MED ORDER — NURTEC 75 MG PO TBDP: ORAL_TABLET | ORAL | 0 refills | Status: DC

## 2021-10-21 NOTE — Telephone Encounter (Signed)
Placed order for Nurtec 75 mg x 6 tablets. Printed copy of med list. Samples ready for pickup.  ?

## 2021-10-21 NOTE — Telephone Encounter (Signed)
Samples are fine. 3 boxes ?

## 2021-10-21 NOTE — Addendum Note (Signed)
Addended by: Gildardo Griffes on: 10/21/2021 03:37 PM ? ? Modules accepted: Orders ? ?

## 2021-10-21 NOTE — Telephone Encounter (Signed)
Received a determination from the Nurtec appeal request for 15 tablets/30 days. Unfortunately the request remains denied. The patient is only able to receive 8 tablets per month.  ?

## 2021-10-23 MED ORDER — BOTOX 200 UNITS IJ SOLR: INTRAMUSCULAR | 1 refills | Status: DC

## 2021-10-23 NOTE — Addendum Note (Signed)
Addended by: Gildardo Griffes on: 10/23/2021 10:02 AM ? ? Modules accepted: Orders ? ?

## 2021-10-23 NOTE — Telephone Encounter (Signed)
Please send Botox Rx to [facility]. ?

## 2021-10-23 NOTE — Telephone Encounter (Signed)
Received approval from [place], [place] # [id number] (10/16/2021-04/02/2022). ?

## 2021-10-29 DIAGNOSIS — R69 Illness, unspecified: Secondary | ICD-10-CM | POA: Diagnosis not present

## 2021-10-30 NOTE — Telephone Encounter (Signed)
Received (1) 200 unit vial of Botox from [facility]. ?

## 2021-11-03 ENCOUNTER — Encounter: Payer: Self-pay | Admitting: Adult Health

## 2021-11-05 ENCOUNTER — Encounter: Payer: Self-pay | Admitting: Adult Health

## 2021-11-05 ENCOUNTER — Ambulatory Visit: Payer: BC Managed Care – PPO | Admitting: Adult Health

## 2021-11-05 DIAGNOSIS — G43009 Migraine without aura, not intractable, without status migrainosus: Secondary | ICD-10-CM

## 2021-11-05 NOTE — Progress Notes (Signed)
? ?  11/05/21: First Botox injection. ? ? ? ?BOTOX PROCEDURE NOTE FOR MIGRAINE HEADACHE ? ? ? ?Contraindications and precautions discussed with patient(above). Aseptic procedure was observed and patient tolerated procedure. Procedure performed by Ward Givens, NP ? ?The condition has existed for more than 6 months, and pt does not have a diagnosis of ALS, Myasthenia Gravis or Lambert-Eaton Syndrome.  Risks and benefits of injections discussed and pt agrees to proceed with the procedure.  Written consent obtained ? ?These injections are medically necessary.  These injections do not cause sedations or hallucinations which the oral therapies may cause. ? ?Indication/Diagnosis: chronic migraine ?MVHQI(O9629) injection was performed according to protocol by [employer]. 200 units of BOTOX was dissolved into 4 cc NS.   ?[place]: [phone] ? ?Type of toxin: Botox ?Botox- 200 units x 1 vial ?Lot: [id number] ?Expiration: 04/2024 ?[place]: [phone] ?  ?Bacteriostatic 0.9% Sodium Chloride- 75m total ?Lot: [id number]?Expiration: 03/11/2023 ?[place] [id number]?  ?Dx: GC16.606? ?Description of procedure: ? ?The patient was placed in a sitting position. The standard protocol was used for Botox as follows, with 5 units of Botox injected at each site: ? ? ?-Procerus muscle, midline injection ? ?-Corrugator muscle, bilateral injection ? ?-Frontalis muscle, bilateral injection, with 2 sites each side, medial injection was performed in the upper one third of the frontalis muscle, in the region vertical from the medial inferior edge of the superior orbital rim. The lateral injection was again in the upper one third of the forehead vertically above the lateral limbus of the cornea, 1.5 cm lateral to the medial injection site. ? ?-Temporalis muscle injection, 4 sites, bilaterally. The first injection was 3 cm above the tragus of the ear, second injection site was 1.5 cm to 3 cm up from the first injection site in line with the tragus of the ear.  The third injection site was 1.5-3 cm forward between the first 2 injection sites. The fourth injection site was 1.5 cm posterior to the second injection site. ? ?-Occipitalis muscle injection, 3 sites, bilaterally. The first injection was done one half way between the occipital protuberance and the tip of the mastoid process behind the ear. The second injection site was done lateral and superior to the first, 1 fingerbreadth from the first injection. The third injection site was 1 fingerbreadth superiorly and medially from the first injection site. ? ?-Cervical paraspinal muscle injection, 2 sites, bilateral knee first injection site was 1 cm from the midline of the cervical spine, 3 cm inferior to the lower border of the occipital protuberance. The second injection site was 1.5 cm superiorly and laterally to the first injection site. ? ?-Trapezius muscle injection was performed at 3 sites, bilaterally. The first injection site was in the upper trapezius muscle halfway between the inflection point of the neck, and the acromion. The second injection site was one half way between the acromion and the first injection site. The third injection was done between the first injection site and the inflection point of the neck. ? ? ?Will return for repeat injection in 3 months. ? ? ?A 200 unit sof Botox was used, 155 units were injected, the rest of the Botox was wasted. The patient tolerated the procedure well, there were no complications of the above procedure. ? ?MWard Givens MSN, NP-C 11/05/2021, 8:21 AM ?[facility] ?[address] ?[place] [address]?[phone] ?

## 2021-11-05 NOTE — Progress Notes (Signed)
Botox- 200 units x 1 vial ?Lot: [id number] ?Expiration: 04/2024 ?[place]: [phone] ? ?Bacteriostatic 0.9% Sodium Chloride- 41m total ?Lot: [id number]?Expiration: 03/11/2023 ?[place] [id number]? ?Dx: GF63.846?S/P  ?

## 2021-11-11 DIAGNOSIS — H8110 Benign paroxysmal vertigo, unspecified ear: Secondary | ICD-10-CM | POA: Diagnosis not present

## 2021-11-11 DIAGNOSIS — R002 Palpitations: Secondary | ICD-10-CM | POA: Diagnosis not present

## 2021-11-11 MED ORDER — NURTEC 75 MG PO TBDP: ORAL_TABLET | ORAL | 11 refills | Status: DC

## 2021-11-11 NOTE — Telephone Encounter (Signed)
[facility] Processed for 8 tablets/30 days. Sent refills. Insurance only covers 8 not 15 per month. Patient has been updated.  ?

## 2021-11-14 ENCOUNTER — Other Ambulatory Visit: Payer: Self-pay | Admitting: Adult Health

## 2021-11-18 ENCOUNTER — Encounter: Payer: Self-pay | Admitting: *Deleted

## 2021-11-24 DIAGNOSIS — L719 Rosacea, unspecified: Secondary | ICD-10-CM | POA: Diagnosis not present

## 2021-11-27 ENCOUNTER — Other Ambulatory Visit: Payer: Self-pay | Admitting: *Deleted

## 2021-12-10 NOTE — Telephone Encounter (Signed)
[facility] at [phone]. They are closed for lunch until 2pm.  ?Looks like Catherine Clay previously documented pt limited to #8/30 days. Wanting to make sure pharmacy running claim this way. ?

## 2021-12-11 MED ORDER — BOTOX 200 UNITS IJ SOLR: INTRAMUSCULAR | 1 refills | Status: DC

## 2021-12-11 NOTE — Addendum Note (Signed)
Addended by: Gildardo Griffes on: 12/11/2021 10:44 AM ? ? Modules accepted: Orders ? ?

## 2021-12-11 NOTE — Telephone Encounter (Signed)
Please send Botox RX to [facility]. 

## 2021-12-29 DIAGNOSIS — L700 Acne vulgaris: Secondary | ICD-10-CM | POA: Diagnosis not present

## 2021-12-31 ENCOUNTER — Telehealth: Payer: Self-pay | Admitting: Adult Health

## 2021-12-31 NOTE — Telephone Encounter (Signed)
Coming to talk to you about this

## 2021-12-31 NOTE — Telephone Encounter (Signed)
Patient is currently not on Ajovy because insurance will not cover both. She will be one month late on her Botox. Patient asking if she can have Ajovy.

## 2021-12-31 NOTE — Telephone Encounter (Signed)
Called pt and LVM with office number for call or she can respond to my mychart message.

## 2021-12-31 NOTE — Telephone Encounter (Signed)
I got pt rescheduled however she is wanting to know if her Ajovy can be increased since she will have to stop with the Botox injections for the month of June. Pt requesting a call back.

## 2021-12-31 NOTE — Telephone Encounter (Signed)
Patient left a vm needing to reschedule her June appt due to her husband getting a new job and will not have insurance for the month of June.

## 2022-01-28 ENCOUNTER — Ambulatory Visit: Payer: BC Managed Care – PPO | Admitting: Adult Health

## 2022-02-16 ENCOUNTER — Telehealth: Payer: Self-pay | Admitting: Adult Health

## 2022-02-16 NOTE — Telephone Encounter (Signed)
Pt has new insurance, called gave me the information this morning. I called [employer] spoke with rep Radonna Ricker she states pt's plan can be Administrator, arts. Specialty pharmacy to use would be [facility] [phone], reference number for Ally's call is [phone].

## 2022-02-16 NOTE — Telephone Encounter (Signed)
I completed [employer] Botox PA form, placed in Nurse Pod for NP signature.

## 2022-02-16 NOTE — Telephone Encounter (Signed)
I called pt back, she states she has new insurance. She gave the info over the phone, I let her know that I will call them to initiate a new PA, and see if they are B/B or SP.

## 2022-02-16 NOTE — Telephone Encounter (Signed)
Pt is asking for a call from Botox coordinator to discuss her appointment and medication

## 2022-02-18 NOTE — Telephone Encounter (Signed)
I completed [employer]) PA form, placed in Nurse Pod for NP signature. Faxed Signed PA to [facility] @ [phone].

## 2022-02-23 ENCOUNTER — Telehealth: Payer: Self-pay

## 2022-02-23 ENCOUNTER — Encounter: Payer: Self-pay | Admitting: Adult Health

## 2022-02-23 NOTE — Telephone Encounter (Signed)
PA for nurtec has been sent.   (Key: [id number])   Next Steps The plan will fax you a determination, typically within 1 to 5 business days. Form [employer] stage manager Form Prior Authorization Form for [employer] [phone] [phone]

## 2022-02-24 NOTE — Telephone Encounter (Signed)
Received fax from [employer].  PA has been approved for Nurtec dates from 02/23/2022-02/21/2023. Pt notified of approval.

## 2022-02-25 NOTE — Telephone Encounter (Signed)
I called pt, lm about insurance and that I need to get her appt rescheduled.

## 2022-02-25 NOTE — Telephone Encounter (Signed)
I called pts insurance spoke with Lilyan Punt she states pt is just now being processed into their system and to refax the PA. Having to cancel pt's 07/20 appt with [facility] NP.

## 2022-02-26 ENCOUNTER — Ambulatory Visit: Payer: Self-pay | Admitting: Adult Health

## 2022-02-26 NOTE — Telephone Encounter (Signed)
Received a call from [facility], spoke with rep Francisca December instructed me to send PA to another fax number. Faxed PA to [phone]. Reference # for today's call is Francisca December 9:31 EST [id number].

## 2022-03-04 NOTE — Telephone Encounter (Signed)
Pt is calling to follow up with insurance approval. Pt is requesting a call back because she have question.

## 2022-03-05 ENCOUNTER — Telehealth: Payer: Self-pay | Admitting: Adult Health

## 2022-03-05 NOTE — Telephone Encounter (Signed)
Insurance denied Botox, not medically necessry.

## 2022-03-05 NOTE — Telephone Encounter (Signed)
Pt said, insurance denied Botox because not medically necessary. Insurance asking Botox Coordinator to [phone]. Would able to discuss what information needed to approve Botox injection. Would  like a call from the Botox Coordinator after discussing with insurance.

## 2022-03-09 NOTE — Telephone Encounter (Signed)
[facility] Mikki Santee) working on [place]  for Botox. We see it was denied. Would like a call back to discuss if need any assistance. Phone:[phone]  Ext: [phone]

## 2022-03-18 ENCOUNTER — Encounter: Payer: Self-pay | Admitting: Adult Health

## 2022-03-18 ENCOUNTER — Telehealth: Payer: Self-pay | Admitting: Adult Health

## 2022-03-18 NOTE — Telephone Encounter (Signed)
I called [employer] @  [phone], spoke with Daphney H she states they are needing more clinical information in order to get pt's botox approved, Daphney gave me a fax number to fax the clinicals to.

## 2022-03-18 NOTE — Telephone Encounter (Signed)
I called [employer] @  [phone], spoke with Daphney H she states they are needing more clinical information in order to get pt's botox approved, Daphney gave me a fax number to fax the clinicals to.

## 2022-03-18 NOTE — Telephone Encounter (Signed)
Pt is asking for a call to discuss information being sent to her insurance that the Botox is medically necessary for her, please call.

## 2022-03-19 NOTE — Telephone Encounter (Signed)
Reviewed denial letter. Pt needs to meet the diagnosis of chronic migraine and also needs to try two drugs from different classes such as ARBs or ACE-I, Antidepressants, Antiepileptic drugs, and Beta-blockers, or have a contraindication to all of the drugs in those classes due to health reason. Patient has tried Topamax from 05/29/2019-09/25/2021. She will need to try another drug from a different class. Megan NP aware. Pt has an appt next week on 8/15 to discuss medications to try.

## 2022-03-24 ENCOUNTER — Ambulatory Visit: Payer: 59 | Admitting: Adult Health

## 2022-03-24 ENCOUNTER — Encounter: Payer: Self-pay | Admitting: Adult Health

## 2022-03-24 VITALS — BP 120/72 | HR 68 | Ht 64.0 in | Wt 119.0 lb

## 2022-03-24 DIAGNOSIS — G43009 Migraine without aura, not intractable, without status migrainosus: Secondary | ICD-10-CM | POA: Diagnosis not present

## 2022-03-24 DIAGNOSIS — G43719 Chronic migraine without aura, intractable, without status migrainosus: Secondary | ICD-10-CM

## 2022-03-24 MED ORDER — NORTRIPTYLINE HCL 10 MG PO CAPS: 10.0000 mg | ORAL_CAPSULE | Freq: Every day | ORAL | 5 refills | Status: DC

## 2022-03-24 NOTE — Progress Notes (Signed)
PATIENT: Catherine Clay DOB: [birth year]  REASON FOR VISIT: follow up HISTORY FROM: patient PRIMARY NEUROLOGIST:   HISTORY OF PRESENT ILLNESS: Today 03/24/22:  Catherine Clay is a 36 year old female with migraine headaches.  She returns today for follow-up.  After her first round of Botox reports that she was having 3-4 headaches a week but the severity of the headaches was not bad. When she takes nurtec it takes about 30 minutes for it to resolve. She has only had 1 round of botox. Reports that she is having more vertigo. Went to PCP and they gave her meclizine. She does get vertigo usually prior to the migraine starting. Sometimes its just vertigo and no migraine comes. She uses meclizine when she is feeling dizzy. Sometimes that resolves the symptoms and the headache does not come. If migraine follows she will then take a nurtec.   Patient changed her insurance and they will no longer approve Botox since she has not tried a blood pressure medicine or an antidepressant.  A blood pressure medicine is currently contraindicated for this patient as her blood pressure normally runs low as does her heart rate.  She is amenable to trying an antidepressant.  She has never tried nortriptyline.   Tried and Failed: Topamax- worked great initially but then it stopped working, Trileptal- didn't help, Ajovy- didn't help.     REVIEW OF SYSTEMS: Out of a complete 14 system review of symptoms, the patient complains only of the following symptoms, and all other reviewed systems are negative.  ALLERGIES: Allergies  Allergen Reactions   Ceclor [Cefaclor] Hives    HOME MEDICATIONS: Outpatient Medications Prior to Visit  Medication Sig Dispense Refill   CLARAVIS 40 MG capsule Take 40 mg by mouth daily.     clobetasol ointment (TEMOVATE) 0.05 % Apply pea size amount to vulva 1-2 x a day for up to 2 weeks as needed. Not for daily [place] term use. 60 g 0   fexofenadine (ALLEGRA) 180 MG tablet Take 180 mg by mouth  daily.     fluticasone (FLONASE) 50 MCG/ACT nasal spray Place into both nostrils daily.     levonorgestrel (MIRENA) 20 MCG/24HR IUD 1 each by Intrauterine route once.     levothyroxine (SYNTHROID) 100 MCG tablet Take 100 mcg by mouth daily before breakfast.     meclizine (ANTIVERT) 25 MG tablet Take 25 mg by mouth every 6 (six) hours as needed.     Multiple Vitamins-Minerals (MULTIVITAMIN WITH MINERALS) tablet Take 1 tablet by mouth daily.       ondansetron (ZOFRAN) 8 MG tablet Take 1 tablet (8 mg total) by mouth every 8 (eight) hours as needed for nausea or vomiting. 30 tablet 11   Polyethylene Glycol 3350 (MIRALAX PO) Take by mouth.     Rimegepant Sulfate (NURTEC) 75 MG TBDP Take 1 tablet at the onset of migraine. Only 1 tab in 24 hours 8 tablet 11   Botulinum Toxin Type A (BOTOX) 200 units SOLR Provider to inject 155 units into the muscles of the head and neck every 12 weeks. Discard remainder. (Patient not taking: Reported on 03/24/2022) 1 each 1   No facility-administered medications prior to visit.    PAST MEDICAL HISTORY: Past Medical History:  Diagnosis Date   Broken finger    left pinky   IBS (irritable bowel syndrome)    Migraine without aura    Neuroma    Rosacea    Thyroid disease     PAST  SURGICAL HISTORY: Past Surgical History:  Procedure Laterality Date   COLONOSCOPY     FINGER FRACTURE SURGERY Right    5th finger   WISDOM TOOTH EXTRACTION      FAMILY HISTORY: Family History  Problem Relation Age of Onset   Cervical cancer Mother    Hypertension Father    Thyroid disease Father        hypothyroid   Thyroid disease Sister        hypothyroid   Thyroid disease Maternal Aunt        hypothyroid   Migraines Paternal Aunt    Skin cancer Maternal Grandmother    Colon cancer Paternal Grandfather     SOCIAL HISTORY: Social History   Socioeconomic History   Marital status: Married    Spouse name: Not on file   Number of children: 0   Years of education:  Not on file   Highest education level: Bachelor's degree (e.g., BA, AB, BS)  Occupational History   Not on file  Tobacco Use   Smoking status: Never   Smokeless tobacco: Never  Vaping Use   Vaping Use: Never used  Substance and Sexual Activity   Alcohol use: No   Drug use: No   Sexual activity: Yes    Partners: Male    Birth control/protection: I.U.D.  Other Topics Concern   Not on file  Social History Narrative   Lives at home with her husband   Recently moved into a new home   Right handed    Caffeine: 2 cups of coffee daily    Social Determinants of Health   Financial Resource Strain: Not on file  Food Insecurity: Not on file  Transportation Needs: Not on file  Physical Activity: Not on file  Stress: Not on file  Social Connections: Not on file  Intimate Partner Violence: Not on file      PHYSICAL EXAM  Vitals:   03/24/22 0743  BP: 120/72  Pulse: 68  Weight: 119 lb (54 kg)  Height: '5\' 4"'$  (1.626 m)   Body mass index is 20.43 kg/m.  Generalized: Well developed, in no acute distress   Neurological examination  Mentation: Alert oriented to time, place, history taking. Follows all commands speech and language fluent Cranial nerve II-XII: Pupils were equal round reactive to light. Extraocular movements were full, visual field were full on confrontational test. Facial sensation and strength were normal.. Head turning and shoulder shrug  were normal and symmetric. Motor: The motor testing reveals 5 over 5 strength of all 4 extremities. Good symmetric motor tone is noted throughout.  Sensory: Sensory testing is intact to soft touch on all 4 extremities. No evidence of extinction is noted.  Coordination: Cerebellar testing reveals good finger-nose-finger and heel-to-shin bilaterally.  Gait and station: Gait is normal.  Reflexes: Deep tendon reflexes are symmetric and normal bilaterally.   DIAGNOSTIC DATA (LABS, IMAGING, TESTING) - I reviewed patient records, labs,  notes, testing and imaging myself where available.  Lab Results  Component Value Date   WBC 5.9 12/15/2018   HGB 13.8 12/15/2018   HCT 40.3 12/15/2018   MCV 92 12/15/2018   PLT 286 12/15/2018      Component Value Date/Time   NA 137 12/15/2018 1546   K 4.3 12/15/2018 1546   CL 102 12/15/2018 1546   CO2 22 12/15/2018 1546   GLUCOSE 82 12/15/2018 1546   GLUCOSE 108 (H) 05/02/2017 1956   BUN 7 12/15/2018 1546   CREATININE 0.79 12/15/2018 1546  CALCIUM 9.5 12/15/2018 1546   PROT 7.4 12/15/2018 1546   ALBUMIN 4.2 12/15/2018 1546   AST 20 12/15/2018 1546   ALT 16 12/15/2018 1546   ALKPHOS 68 12/15/2018 1546   BILITOT 0.3 12/15/2018 1546   GFRNONAA 101 12/15/2018 1546   GFRAA 116 12/15/2018 1546    Lab Results  Component Value Date   TSH 3.289 02/26/2011      ASSESSMENT AND PLAN 36 y.o. year old female  has a past medical history of Broken finger, IBS (irritable bowel syndrome), Migraine without aura, Neuroma, Rosacea, and Thyroid disease. here with:  1.  Migraine headache  -Try nortriptyline 10 mg at bedtime -I reviewed side effects with the patient.  The patient does have a history of IBS.  She will need to monitor for constipation.  Encouraged the patient to stay well-hydrated -If nortriptyline is not beneficial then we will try to get Botox approved for the patient. -Follow-up in 3 months or sooner if needed    Ward Givens, MSN, NP-C 03/24/2022, 7:55 AM [facility] [address] [place], [place] [address] [phone]

## 2022-03-24 NOTE — Patient Instructions (Signed)
Your Plan:  Try Nortriptyline 10 mg at bedtime If your symptoms worsen or you develop new symptoms please let us know.   Thank you for coming to see [place] at [facility]. I hope we have been able to provide you high quality care today.  You may receive a patient satisfaction survey over the next few weeks. We would appreciate your feedback and comments so that we may continue to improve ourselves and the health of our patients.

## 2022-03-30 ENCOUNTER — Encounter: Payer: Self-pay | Admitting: Adult Health

## 2022-04-05 ENCOUNTER — Encounter: Payer: Self-pay | Admitting: Adult Health

## 2022-04-07 MED ORDER — METHYLPREDNISOLONE 4 MG PO TBPK: ORAL_TABLET | ORAL | 0 refills | Status: DC

## 2022-04-07 NOTE — Telephone Encounter (Signed)
yes

## 2022-04-08 NOTE — Telephone Encounter (Signed)
I called the [place] peer review department at [phone] and LVM asking for a call back to do an appeal. Left office number in message.   Reference # for PA [id number]

## 2022-04-14 NOTE — Telephone Encounter (Signed)
I called the [place] peer review department at [phone] and LVM again, asking for a call back to do an appeal. Left office number in message.    Reference # for PA [id number]

## 2022-04-15 ENCOUNTER — Telehealth: Payer: Self-pay | Admitting: Adult Health

## 2022-04-15 ENCOUNTER — Ambulatory Visit: Payer: BC Managed Care – PPO | Admitting: Obstetrics and Gynecology

## 2022-04-15 NOTE — Telephone Encounter (Signed)
Catherine Clay is calling. Stated he is appeal nurse working for [employer]. Catherine Clay is calling to see if there any help needed with appeal for Botox.

## 2022-04-15 NOTE — Telephone Encounter (Addendum)
Received a call back from [place]. I spoke with [place] (reference # [id number]). I was told the appeal must be done over fax (the denial letter did not say this) and the fax numbers are as follows: Standard request: [phone] Expedited request: [id number]  Send letter with clinical info to support.

## 2022-04-15 NOTE — Telephone Encounter (Signed)
I returned Bob's call and LVM for call back.

## 2022-04-16 ENCOUNTER — Encounter: Payer: Self-pay | Admitting: *Deleted

## 2022-04-16 NOTE — Telephone Encounter (Signed)
The first page of the denial letter was faxed to [facility] along with the rest of the appeal info.

## 2022-04-16 NOTE — Telephone Encounter (Signed)
LVM at 10:08 am;  [facility] (Bob0 returning Bethany's call from yesterday. Regarding patient's Botox denial. If you are going to submit clinical for appeal suggest  including copy of denial letter. I do have access to [place] PA portal,so I can keep an  eye on things once its submitted. If you do have questions or need any help can call me back. Contact info: [id number], ext: [phone]

## 2022-04-16 NOTE — Telephone Encounter (Signed)
Appeal letter has been written, signed, and faxed along with office notes and first page of denial letter to plan at the expedited request number. Received a receipt of confirmation.

## 2022-04-27 NOTE — Telephone Encounter (Signed)
Received this message from the patient:  [facility] Clinical Pool (supporting Ward Givens, NP) 5 hours ago (10:06 AM)    Hi Nurse Romelle Starcher,   I was able to talk to someone at my insurance office about the botox medication being reconsidered and they said that they have not received anything from you all about this.  They gave me a fax number: [phone].  I am not sure if this is the number you were given originally or not.  They said that if you need to call to follow-up with them, you have to call the number [phone] and you have to ask for the pharmacy.  I don't know why my insurance company is making it so difficult for you all to get in touch with them, and I am sorry you all have been dealing with this headache as well.   Thank you again for all of your help with this.   Mel Almond

## 2022-04-27 NOTE — Telephone Encounter (Signed)
I called [phone] and spoke with Catherine Clay. He said the pt's Botox is handled on the medical side, not the pharmacy side. He said the pt doesn't have pharmacy benefits. He transferred me to the medical department. I spoke with Catherine Clay (ref # [id number]). I was told the two fax numbers I was given are correct but they haven't received the appeal. I was told to fax the appeal again to [phone]. This has been faxed again. Received a receipt of confirmation.

## 2022-04-28 NOTE — Telephone Encounter (Signed)
[name] @ [facility] called to report their inventory is low so they should review this expedited appeal request within the next few days, if there are questions the call back # is [phone]

## 2022-04-28 NOTE — Telephone Encounter (Signed)
Catherine Clay   LB   04/28/22  1:43 PM Note [name] @ [employer] called to report their inventory is low so they should review this expedited appeal request within the next few days, if there are questions the call back # is [phone]

## 2022-04-28 NOTE — Telephone Encounter (Signed)
I called [employer] to see if they received the appeal. I spoke with Lesly Rubenstein in the medical pre-cert department. She said they had not received the appeal that was faxed with receipt confirmation on 04/27/22 around 5 pm. I was told the correct fax number to send the appeal to is [phone]. I repeated this back to her twice. I was told if this appeal isn't determined to meet expedited criteria it will be put in the normal turnaround file which will take 30 days. Lesly Rubenstein confirmed this request will take care of auth for [phone] and [phone] and the Botox will be B&B. She provided a reference # of [id number]. She also gave her dept phone number which is [phone].  I faxed the appeal to the number that was provided above. Received a receipt of confirmation.

## 2022-04-28 NOTE — Telephone Encounter (Signed)
Noted, so glad they received it.

## 2022-05-05 NOTE — Progress Notes (Signed)
36 y.o. G0P0000 Married White or Caucasian Not Hispanic or Latino female here for annual exam.  She has a mirena IUD, placed in 5/21. No bleeding. Sexually active, no pain.  IUD strings missing, U/S 04/13/21 the IUD was in place.  H/O lichen sclerosis, occasional flare uses steroid ointment prn  H/O IBS-C, daily miralax keeps her regular.     No LMP recorded. (Menstrual status: IUD).          Sexually active: Yes.    The current method of family planning is IUD.  Mirena placed 12/2019. Exercising: Yes.    running Smoker:  no  Health Maintenance: Pap:  08/09/18 wnl, HPV not detected Pap: 2017 negative--per patient History of abnormal Pap:  no MMG:  none BMD:   none Colonoscopy: none TDaP:  01/08/18 Gardasil: complete   reports that she has never smoked. She has never used smokeless tobacco. She reports that she does not drink alcohol and does not use drugs. 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   Past Medical History:  Diagnosis Date   Broken finger    left pinky   IBS (irritable bowel syndrome)    Migraine without aura    Neuroma    Rosacea    Thyroid disease     Past Surgical History:  Procedure Laterality Date   COLONOSCOPY     FINGER FRACTURE SURGERY Right    5th finger   WISDOM TOOTH EXTRACTION      Current Outpatient Medications  Medication Sig Dispense Refill   Botulinum Toxin Type A (BOTOX) 200 units SOLR Provider to inject 155 units into the muscles of the head and neck every 12 weeks. Discard remainder. 1 each 1   clobetasol ointment (TEMOVATE) 0.05 % Apply pea size amount to vulva 1-2 x a day for up to 2 weeks as needed. Not for daily [place] term use. 60 g 0   fexofenadine (ALLEGRA) 180 MG tablet Take 180 mg by mouth daily.     fluticasone (FLONASE) 50 MCG/ACT nasal spray Place into both nostrils daily.     levonorgestrel (MIRENA) 20 MCG/24HR IUD 1 each by Intrauterine route  once.     levothyroxine (SYNTHROID) 100 MCG tablet Take 100 mcg by mouth daily before breakfast.     meclizine (ANTIVERT) 25 MG tablet Take 25 mg by mouth every 6 (six) hours as needed.     Multiple Vitamins-Minerals (MULTIVITAMIN WITH MINERALS) tablet Take 1 tablet by mouth daily.       ondansetron (ZOFRAN) 8 MG tablet Take 1 tablet (8 mg total) by mouth every 8 (eight) hours as needed for nausea or vomiting. 30 tablet 11   Polyethylene Glycol 3350 (MIRALAX PO) Take by mouth.     Rimegepant Sulfate (NURTEC) 75 MG TBDP Take 1 tablet at the onset of migraine. Only 1 tab in 24 hours 8 tablet 11   No current facility-administered medications for this visit.    Family History  Problem Relation Age of Onset   Cervical cancer Mother    Hypertension Father    Thyroid disease Father        hypothyroid   Thyroid disease Sister        hypothyroid   Thyroid disease Maternal Aunt        hypothyroid   Migraines Paternal Aunt    Skin cancer Maternal Grandmother    Colon cancer Paternal Grandfather     Review  of Systems  All other systems reviewed and are negative.   Exam:   BP 112/60 (BP Location: Left Arm, Patient Position: Sitting, Cuff Size: Normal)   Pulse 76   Ht 5' 3.75" (1.619 m)   Wt 117 lb (53.1 kg)   SpO2 99%   BMI 20.24 kg/m   Weight change: '@WEIGHTCHANGE'$ @ Height:   Height: 5' 3.75" (161.9 cm)  Ht Readings from Last 3 Encounters:  05/08/22 5' 3.75" (1.619 m)  03/24/22 '5\' 4"'$  (1.626 m)  09/25/21 '5\' 4"'$  (1.626 m)    General appearance: alert, cooperative and appears stated age Head: Normocephalic, without obvious abnormality, atraumatic Neck: no adenopathy, supple, symmetrical, trachea midline and thyroid normal to inspection and palpation Lungs: clear to auscultation bilaterally Cardiovascular: regular rate and rhythm Breasts: normal appearance, no masses or tenderness Abdomen: soft, non-tender; non distended,  no masses,  no organomegaly Extremities: extremities normal,  atraumatic, no cyanosis or edema Skin: Skin color, texture, turgor normal. No rashes or lesions Lymph nodes: Cervical, supraclavicular, and axillary nodes normal. No abnormal inguinal nodes palpated Neurologic: Grossly normal   Pelvic: External genitalia:  no lesions, mild diffuse whiting on the upper, inner vulva. No labia minora seen, loss of architecture of the clitoris. No fissures, no plaques.               Urethra:  normal appearing urethra with no masses, tenderness or lesions              Bartholins and Skenes: normal                 Vagina: normal appearing vagina with normal color and discharge, no lesions              Cervix: no lesions and IUD string not seen, not able to tease it out of the cervix with a cytobrush               Bimanual Exam:  Uterus:  normal size, contour, position, consistency, mobility, non-tender              Adnexa: no mass, fullness, tenderness               Rectovaginal: Confirms               Anus:  normal sphincter tone, no lesions  Santiago Glad, CMA chaperoned for the exam.    1. Well woman exam Pap due next year Labs with primary  2. IUD check up Doing well Strings not seen, in place on prior u/s  3. Lichen sclerosus of female genitalia - clobetasol ointment (TEMOVATE) 0.05 %; Apply pea size amount to vulva 1 x a week. With a flare, increase to 2 x a day for up to 2 weeks as needed. Not for daily [place] term use.  Dispense: 60 g; Refill: 1

## 2022-05-08 ENCOUNTER — Ambulatory Visit (INDEPENDENT_AMBULATORY_CARE_PROVIDER_SITE_OTHER): Payer: 59 | Admitting: Obstetrics and Gynecology

## 2022-05-08 ENCOUNTER — Encounter: Payer: Self-pay | Admitting: Obstetrics and Gynecology

## 2022-05-08 VITALS — BP 112/60 | HR 76 | Ht 63.75 in | Wt 117.0 lb

## 2022-05-08 DIAGNOSIS — Z30431 Encounter for routine checking of intrauterine contraceptive device: Secondary | ICD-10-CM | POA: Diagnosis not present

## 2022-05-08 DIAGNOSIS — Z01419 Encounter for gynecological examination (general) (routine) without abnormal findings: Secondary | ICD-10-CM

## 2022-05-08 DIAGNOSIS — N904 Leukoplakia of vulva: Secondary | ICD-10-CM | POA: Diagnosis not present

## 2022-05-08 MED ORDER — CLOBETASOL PROPIONATE 0.05 % EX OINT: TOPICAL_OINTMENT | CUTANEOUS | 1 refills | Status: DC

## 2022-05-08 NOTE — Patient Instructions (Signed)

## 2022-05-18 NOTE — Telephone Encounter (Signed)
Called Catherine Clay to check status on appeal. LVM asking for call back asap. Pt had sent [place] a mychart message stating she was told it was approved but we have not heard anything yet from [place].

## 2022-05-27 ENCOUNTER — Other Ambulatory Visit (HOSPITAL_COMMUNITY): Payer: Self-pay

## 2022-05-27 ENCOUNTER — Telehealth: Payer: Self-pay | Admitting: *Deleted

## 2022-05-27 NOTE — Telephone Encounter (Signed)
[employer] Benefit Verification [id number] Submitted

## 2022-05-27 NOTE — Telephone Encounter (Signed)
Will the PA team do a benefits review for Botox asap and see if there is an approval on file? The patient told us our appeal got approved. Catherine Clay has yet to call me back to confirm so I have no dates or authorization number.    Chronic Migraine CPT 64615  Botox J0585 Units:200  G43.719 Chronic Migraine without aura,  intractable, without status migrainous

## 2022-05-29 ENCOUNTER — Other Ambulatory Visit (HOSPITAL_COMMUNITY): Payer: Self-pay

## 2022-05-29 NOTE — Telephone Encounter (Signed)
Patient Advocate Encounter   Received notification that prior authorization for Botox 200UNIT solution is required.   PA submitted on 05/29/2022 Key [id number] Status is pending       Lyndel Safe, [place] Patient Advocate Specialist [place] Patient Advocate Team Direct Number: [phone]  Fax: [phone]

## 2022-05-29 NOTE — Telephone Encounter (Signed)
Found out there is already an approval on file through [place] for [place].  Must get it through [place].

## 2022-05-29 NOTE — Telephone Encounter (Signed)
   Botox[employer] benefits verification scanned to chart

## 2022-06-01 ENCOUNTER — Ambulatory Visit: Payer: 59 | Admitting: Adult Health

## 2022-06-01 DIAGNOSIS — G43719 Chronic migraine without aura, intractable, without status migrainosus: Secondary | ICD-10-CM | POA: Diagnosis not present

## 2022-06-01 MED ORDER — ONABOTULINUMTOXINA 200 UNITS IJ SOLR
155.0000 [IU] | Freq: Once | INTRAMUSCULAR | Status: AC
  Administered 2022-06-01: 155 [IU] via INTRAMUSCULAR

## 2022-06-01 NOTE — Progress Notes (Signed)
     06/01/22: Restarting botox today  BOTOX PROCEDURE NOTE FOR MIGRAINE HEADACHE    Contraindications and precautions discussed with patient(above). Aseptic procedure was observed and patient tolerated procedure. Procedure performed by Ward Givens, NP  The condition has existed for more than 6 months, and pt does not have a diagnosis of ALS, Myasthenia Gravis or Lambert-Eaton Syndrome.  Risks and benefits of injections discussed and pt agrees to proceed with the procedure.  Written consent obtained  These injections do not cause sedations or hallucinations which the oral therapies may cause.  Indication/Diagnosis: chronic migraine BOTOX(J0585) injection was performed according to protocol by [facility]. 200 units of BOTOX was dissolved into 4 cc NS.   NDC: [address]-96  Type of toxin: Botox   Botox- 200 units x 1 vial Lot: [id number] Expiration: 09/2024 NDC: 9211-9417-40   Bacteriostatic 0.9% Sodium Chloride- 57m total Lot: [id number]: 03/11/2023 NDC: [address]-02  Dx: GO37.858 Description of procedure:  The patient was placed in a sitting position. The standard protocol was used for Botox as follows, with 5 units of Botox injected at each site:   -Procerus muscle, midline injection  -Corrugator muscle, bilateral injection  -Frontalis muscle, bilateral injection, with 2 sites each side, medial injection was performed in the upper one third of the frontalis muscle, in the region vertical from the medial inferior edge of the superior orbital rim. The lateral injection was again in the upper one third of the forehead vertically above the lateral limbus of the cornea, 1.5 cm lateral to the medial injection site.  -Temporalis muscle injection, 4 sites, bilaterally. The first injection was 3 cm above the tragus of the ear, second injection site was 1.5 cm to 3 cm up from the first injection site in line with the tragus of the ear. The third injection site was 1.5-3 cm  forward between the first 2 injection sites. The fourth injection site was 1.5 cm posterior to the second injection site.  -Occipitalis muscle injection, 3 sites, bilaterally. The first injection was done one half way between the occipital protuberance and the tip of the mastoid process behind the ear. The second injection site was done lateral and superior to the first, 1 fingerbreadth from the first injection. The third injection site was 1 fingerbreadth superiorly and medially from the first injection site.  -Cervical paraspinal muscle injection, 2 sites, bilateral knee first injection site was 1 cm from the midline of the cervical spine, 3 cm inferior to the lower border of the occipital protuberance. The second injection site was 1.5 cm superiorly and laterally to the first injection site.  -Trapezius muscle injection was performed at 3 sites, bilaterally. The first injection site was in the upper trapezius muscle halfway between the inflection point of the neck, and the acromion. The second injection site was one half way between the acromion and the first injection site. The third injection was done between the first injection site and the inflection point of the neck.   Will return for repeat injection in 3 months.   A 200 unit sof Botox was used, 155 units were injected, the rest of the Botox was wasted. The patient tolerated the procedure well, there were no complications of the above procedure.  MWard Givens MSN, NP-C 06/01/2022, 3:37 PM [facility] [address] [place] [phone]

## 2022-06-01 NOTE — Telephone Encounter (Signed)
Called pt & LVM offering an appt today 10/23 at 3:00 PM. Also sent mychart message.

## 2022-06-01 NOTE — Progress Notes (Signed)
Botox consent signed  Botox- 200 units x 1 vial Lot: [id number] Expiration: 09/2024 NDC: 6222-9798-92  Bacteriostatic 0.9% Sodium Chloride- 74m total Lot: [id number]: 03/11/2023 NDC: [address]-85 Dx: GU31.497B/B

## 2022-06-01 NOTE — Telephone Encounter (Signed)
Per Lauro Franklin said it was approved for a year.

## 2022-07-08 ENCOUNTER — Encounter: Payer: Self-pay | Admitting: Adult Health

## 2022-07-08 ENCOUNTER — Ambulatory Visit (INDEPENDENT_AMBULATORY_CARE_PROVIDER_SITE_OTHER): Payer: 59

## 2022-07-08 VITALS — BP 133/76 | HR 66

## 2022-07-08 DIAGNOSIS — G43719 Chronic migraine without aura, intractable, without status migrainosus: Secondary | ICD-10-CM

## 2022-07-08 MED ORDER — KETOROLAC TROMETHAMINE 60 MG/2ML IM SOLN
30.0000 mg | Freq: Once | INTRAMUSCULAR | Status: AC
  Administered 2022-07-08: 30 mg via INTRAMUSCULAR

## 2022-07-08 NOTE — Progress Notes (Signed)
Per Verbal Order Megan NP, Administered 30 mg in Right Deltoid. Pt tolerated well.  Pt denise any changes in headache.

## 2022-07-08 NOTE — Telephone Encounter (Signed)
We could try toradol injection

## 2022-07-09 ENCOUNTER — Ambulatory Visit: Payer: 59 | Admitting: Adult Health

## 2022-08-06 ENCOUNTER — Telehealth: Payer: Self-pay | Admitting: *Deleted

## 2022-08-06 NOTE — Telephone Encounter (Signed)
Received response from [employer] stating auth will expire on 02/24/23.

## 2022-08-06 NOTE — Telephone Encounter (Signed)
Completed Nurtec PA on CMM. Key: [id number]. Awaiting determination from [employer].   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

## 2022-08-17 NOTE — Telephone Encounter (Signed)
Can we do a Botox one benefits review before her next appt on 08/27/22 and confirm pt still has auth on file w/ [employer] from 05/05/22 - 05/06/23 and can you confirm she is buy and bill or is it specialty pharmacy now? There is some question on the patient's end. We went through the appeals process last year to get that approved.   Chronic Migraine CPT 64615   Botox J0585 Units:200   G43.719 Chronic Migraine without aura,  intractable, without status migrainous

## 2022-08-19 ENCOUNTER — Other Ambulatory Visit (HOSPITAL_COMMUNITY): Payer: Self-pay

## 2022-08-21 ENCOUNTER — Encounter: Payer: Self-pay | Admitting: Adult Health

## 2022-08-21 ENCOUNTER — Other Ambulatory Visit (HOSPITAL_COMMUNITY): Payer: Self-pay

## 2022-08-27 ENCOUNTER — Ambulatory Visit: Payer: 59 | Admitting: Adult Health

## 2022-08-27 DIAGNOSIS — G43719 Chronic migraine without aura, intractable, without status migrainosus: Secondary | ICD-10-CM

## 2022-08-27 MED ORDER — ONABOTULINUMTOXINA 200 UNITS IJ SOLR
155.0000 [IU] | Freq: Once | INTRAMUSCULAR | Status: AC
  Administered 2022-08-27: 155 [IU] via INTRAMUSCULAR

## 2022-08-27 NOTE — Progress Notes (Signed)
08/27/22:Reports that botox was working well 2 weeks but then headaches returned. She states she cut out all caffeine and now headaches are better.  06/01/22: Restarting botox today  BOTOX PROCEDURE NOTE FOR MIGRAINE HEADACHE    Contraindications and precautions discussed with patient(above). Aseptic procedure was observed and patient tolerated procedure. Procedure performed by Ward Givens, NP  The condition has existed for more than 6 months, and pt does not have a diagnosis of ALS, Myasthenia Gravis or Lambert-Eaton Syndrome.  Risks and benefits of injections discussed and pt agrees to proceed with the procedure.  Written consent obtained  These injections do not cause sedations or hallucinations which the oral therapies may cause.  Indication/Diagnosis: chronic migraine BOTOX(J0585) injection was performed according to protocol by [facility]. 200 units of BOTOX was dissolved into 4 cc NS.   NDC: [address]-17  Type of toxin: Botox    Botox- 200 units x 1 vial Lot: [id number] Expiration: 12/2024 NDC: 5916-3846-65   Bacteriostatic 0.9% Sodium Chloride- 46m total Lot: [id number]: 11/25 NDC: 877939-030-09  Dx: GQ33.007S/P     Dx: GM22.633 Description of procedure:  The patient was placed in a sitting position. The standard protocol was used for Botox as follows, with 5 units of Botox injected at each site:   -Procerus muscle, midline injection  -Corrugator muscle, bilateral injection  -Frontalis muscle, bilateral injection, with 2 sites each side, medial injection was performed in the upper one third of the frontalis muscle, in the region vertical from the medial inferior edge of the superior orbital rim. The lateral injection was again in the upper one third of the forehead vertically above the lateral limbus of the cornea, 1.5 cm lateral to the medial injection site.  -Temporalis muscle injection, 4 sites, bilaterally. The first injection was 3 cm above the  tragus of the ear, second injection site was 1.5 cm to 3 cm up from the first injection site in line with the tragus of the ear. The third injection site was 1.5-3 cm forward between the first 2 injection sites. The fourth injection site was 1.5 cm posterior to the second injection site.  -Occipitalis muscle injection, 3 sites, bilaterally. The first injection was done one half way between the occipital protuberance and the tip of the mastoid process behind the ear. The second injection site was done lateral and superior to the first, 1 fingerbreadth from the first injection. The third injection site was 1 fingerbreadth superiorly and medially from the first injection site.  -Cervical paraspinal muscle injection, 2 sites, bilateral knee first injection site was 1 cm from the midline of the cervical spine, 3 cm inferior to the lower border of the occipital protuberance. The second injection site was 1.5 cm superiorly and laterally to the first injection site.  -Trapezius muscle injection was performed at 3 sites, bilaterally. The first injection site was in the upper trapezius muscle halfway between the inflection point of the neck, and the acromion. The second injection site was one half way between the acromion and the first injection site. The third injection was done between the first injection site and the inflection point of the neck.   Will return for repeat injection in 3 months.   A 200 units of Botox was used, 155 units were injected, the rest of the Botox was wasted. The patient tolerated the procedure well, there were no complications of the above procedure.  MWard Givens MSN, NP-C 08/27/2022, 8:01 AM [facility] [id number]  [address] [place], [place] [address] [phone]

## 2022-08-27 NOTE — Progress Notes (Signed)
Botox- 200 units x 1 vial Lot: [id number] Expiration: 12/2024 NDC: 5087-1994-12  Bacteriostatic 0.9% Sodium Chloride- 68m total Lot: [id number]: 11/25 NDC: 891792-178-37 Dx: GN42.370S/P

## 2022-08-28 ENCOUNTER — Telehealth: Payer: Self-pay

## 2022-08-28 NOTE — Telephone Encounter (Signed)
BotoxOne

## 2022-09-10 ENCOUNTER — Telehealth: Payer: Self-pay | Admitting: Adult Health

## 2022-09-10 NOTE — Telephone Encounter (Signed)
Catherine Clay is calling from [place]. Stated she needs to talk to someone about Botox not being delivery 06/02/22. Case ID [id number].

## 2022-09-14 ENCOUNTER — Encounter: Payer: Self-pay | Admitting: Adult Health

## 2022-09-14 NOTE — Telephone Encounter (Signed)
At 2:07 [facility]) left a vm asking for a call back at [phone] asking for contact name and the verifying of [facility] address.  Phone rep called in an attempt to provide the information requested.  Phone prompts asked for clinical information so phone rep disconnected call.  Clinical member requested to call and provide information for Botox order

## 2022-09-15 ENCOUNTER — Telehealth: Payer: Self-pay | Admitting: Adult Health

## 2022-09-15 NOTE — Telephone Encounter (Signed)
[facility] called back today (see other phone note) stating to phone staff that Botox is scheduled to be delivered on 2/8. There was no request for [place] to call back or provide further information.

## 2022-09-15 NOTE — Telephone Encounter (Signed)
Catherine Clay from [place] called to schedule Delivery date of BOTOX on 09/17/22  200 units  1 [place]  Signature required

## 2022-09-15 NOTE — Telephone Encounter (Signed)
Noted thanks. Catherine Clay

## 2022-09-15 NOTE — Telephone Encounter (Signed)
I tried to call [facility] back yesterday evening after 5 pm, was not able to reach anyone.

## 2022-09-16 ENCOUNTER — Encounter: Payer: Self-pay | Admitting: Adult Health

## 2022-09-21 ENCOUNTER — Ambulatory Visit: Payer: BC Managed Care – PPO | Admitting: Adult Health

## 2022-09-28 ENCOUNTER — Ambulatory Visit: Payer: BC Managed Care – PPO | Admitting: Adult Health

## 2022-11-19 ENCOUNTER — Ambulatory Visit: Payer: 59 | Admitting: Adult Health

## 2022-11-19 DIAGNOSIS — G43719 Chronic migraine without aura, intractable, without status migrainosus: Secondary | ICD-10-CM

## 2022-11-19 MED ORDER — ONABOTULINUMTOXINA 200 UNITS IJ SOLR
155.0000 [IU] | Freq: Once | INTRAMUSCULAR | Status: AC
  Administered 2022-11-19: 155 [IU] via INTRAMUSCULAR

## 2022-11-19 NOTE — Progress Notes (Signed)
Botox- 200 units x 1 vial Lot: [id number] Expiration: 01/2025 NDC: 0962-8366-29  Bacteriostatic 0.9% Sodium Chloride- 4 mL total Lot: [id number] Expiration: 06/2024 NDC: 03546-568-12  Dx: X51.700 S/P

## 2022-11-19 NOTE — Progress Notes (Signed)
11/19/22: Headaches is much better. Maybe 1-2 mild headaches a week if that.   08/27/22:Reports that botox was working well 2 weeks but then headaches returned. She states she cut out all caffeine and now headaches are better.  06/01/22: Restarting botox today  BOTOX PROCEDURE NOTE FOR MIGRAINE HEADACHE    Contraindications and precautions discussed with patient(above). Aseptic procedure was observed and patient tolerated procedure. Procedure performed by Butch Penny, NP  The condition has existed for more than 6 months, and pt does not have a diagnosis of ALS, Myasthenia Gravis or Lambert-Eaton Syndrome.  Risks and benefits of injections discussed and pt agrees to proceed with the procedure.  Written consent obtained  These injections do not cause sedations or hallucinations which the oral therapies may cause.  Indication/Diagnosis: chronic migraine BOTOX(J0585) injection was performed according to protocol by [facility]. 200 units of BOTOX was dissolved into 4 cc NS.   NDC: [address]-55  Type of toxin: Botox       Botox- 200 units x 1 vial Lot: [id number] Expiration: 01/2025 NDC: 7867-5449-20   Bacteriostatic 0.9% Sodium Chloride- 4 mL total Lot: [id number] Expiration: 06/2024 NDC: 97588-325-49   Dx: I26.415       Description of procedure:  The patient was placed in a sitting position. The standard protocol was used for Botox as follows, with 5 units of Botox injected at each site:   -Procerus muscle, midline injection  -Corrugator muscle, bilateral injection  -Frontalis muscle, bilateral injection, with 2 sites each side, medial injection was performed in the upper one third of the frontalis muscle, in the region vertical from the medial inferior edge of the superior orbital rim. The lateral injection was again in the upper one third of the forehead vertically above the lateral limbus of the cornea, 1.5 cm lateral to the medial injection site.  -Temporalis muscle  injection, 4 sites, bilaterally. The first injection was 3 cm above the tragus of the ear, second injection site was 1.5 cm to 3 cm up from the first injection site in line with the tragus of the ear. The third injection site was 1.5-3 cm forward between the first 2 injection sites. The fourth injection site was 1.5 cm posterior to the second injection site.  -Occipitalis muscle injection, 3 sites, bilaterally. The first injection was done one half way between the occipital protuberance and the tip of the mastoid process behind the ear. The second injection site was done lateral and superior to the first, 1 fingerbreadth from the first injection. The third injection site was 1 fingerbreadth superiorly and medially from the first injection site.  -Cervical paraspinal muscle injection, 2 sites, bilateral knee first injection site was 1 cm from the midline of the cervical spine, 3 cm inferior to the lower border of the occipital protuberance. The second injection site was 1.5 cm superiorly and laterally to the first injection site.  -Trapezius muscle injection was performed at 3 sites, bilaterally. The first injection site was in the upper trapezius muscle halfway between the inflection point of the neck, and the acromion. The second injection site was one half way between the acromion and the first injection site. The third injection was done between the first injection site and the inflection point of the neck.   Will return for repeat injection in 3 months.   A 200 units of Botox was used, 155 units were injected, the rest of the Botox was wasted. The patient tolerated the procedure well, there were no complications of  the above procedure.  Butch Penny, MSN, NP-C 11/19/2022, 8:12 AM [facility] [address] [place], [place] [address] [phone]

## 2022-11-25 ENCOUNTER — Other Ambulatory Visit: Payer: Self-pay | Admitting: Adult Health

## 2022-12-16 ENCOUNTER — Encounter: Payer: Self-pay | Admitting: Adult Health

## 2022-12-30 ENCOUNTER — Encounter: Payer: Self-pay | Admitting: Adult Health

## 2023-01-05 NOTE — Telephone Encounter (Signed)
Agree with seeing PCP

## 2023-02-17 ENCOUNTER — Ambulatory Visit: Payer: 59 | Admitting: Adult Health

## 2023-02-17 DIAGNOSIS — G43719 Chronic migraine without aura, intractable, without status migrainosus: Secondary | ICD-10-CM

## 2023-02-17 DIAGNOSIS — G43009 Migraine without aura, not intractable, without status migrainosus: Secondary | ICD-10-CM

## 2023-02-17 MED ORDER — ONABOTULINUMTOXINA 200 UNITS IJ SOLR
155.0000 [IU] | Freq: Once | INTRAMUSCULAR | Status: AC
  Administered 2023-02-17: 155 [IU] via INTRAMUSCULAR

## 2023-02-17 NOTE — Progress Notes (Signed)
Botox- 200 units x 1 vial Lot: [id number] Expiration: 03/2025 NDC: 4540-9811-91   Bacteriostatic 0.9% Sodium Chloride- 4 mL total Lot: [id number] Expiration: 06/2024 NDC: 21308-657-84   Dx: G43.719 BB  Witnessed by B. Marjo Bicker RN

## 2023-02-17 NOTE — Progress Notes (Signed)
02/17/23: Continues to do well with Botox. Rarely has to use her rescue medication.   11/19/22: Headaches is much better. Maybe 1-2 mild headaches a week if that.   08/27/22:Reports that botox was working well 2 weeks but then headaches returned. She states she cut out all caffeine and now headaches are better.  06/01/22: Restarting botox today  BOTOX PROCEDURE NOTE FOR MIGRAINE HEADACHE    Contraindications and precautions discussed with patient(above). Aseptic procedure was observed and patient tolerated procedure. Procedure performed by Butch Penny, NP  The condition has existed for more than 6 months, and pt does not have a diagnosis of ALS, Myasthenia Gravis or Lambert-Eaton Syndrome.  Risks and benefits of injections discussed and pt agrees to proceed with the procedure.  Written consent obtained  These injections do not cause sedations or hallucinations which the oral therapies may cause.  Indication/Diagnosis: chronic migraine BOTOX(J0585) injection was performed according to protocol by [facility]. 200 units of BOTOX was dissolved into 4 cc NS.   NDC: [address]-53  Type of toxin: Botox   Botox- 200 units x 1 vial Lot: [id number] Expiration: 03/2025 NDC: 7425-9563-87   Bacteriostatic 0.9% Sodium Chloride- 4 mL total Lot: [id number] Expiration: 06/2024 NDC: 51884-166-06   Dx: T01.601    Description of procedure:  The patient was placed in a sitting position. The standard protocol was used for Botox as follows, with 5 units of Botox injected at each site:   -Procerus muscle, midline injection  -Corrugator muscle, bilateral injection  -Frontalis muscle, bilateral injection, with 2 sites each side, medial injection was performed in the upper one third of the frontalis muscle, in the region vertical from the medial inferior edge of the superior orbital rim. The lateral injection was again in the upper one third of the forehead vertically above the lateral limbus of  the cornea, 1.5 cm lateral to the medial injection site.  -Temporalis muscle injection, 4 sites, bilaterally. The first injection was 3 cm above the tragus of the ear, second injection site was 1.5 cm to 3 cm up from the first injection site in line with the tragus of the ear. The third injection site was 1.5-3 cm forward between the first 2 injection sites. The fourth injection site was 1.5 cm posterior to the second injection site.  -Occipitalis muscle injection, 3 sites, bilaterally. The first injection was done one half way between the occipital protuberance and the tip of the mastoid process behind the ear. The second injection site was done lateral and superior to the first, 1 fingerbreadth from the first injection. The third injection site was 1 fingerbreadth superiorly and medially from the first injection site.  -Cervical paraspinal muscle injection, 2 sites, bilateral knee first injection site was 1 cm from the midline of the cervical spine, 3 cm inferior to the lower border of the occipital protuberance. The second injection site was 1.5 cm superiorly and laterally to the first injection site.  -Trapezius muscle injection was performed at 3 sites, bilaterally. The first injection site was in the upper trapezius muscle halfway between the inflection point of the neck, and the acromion. The second injection site was one half way between the acromion and the first injection site. The third injection was done between the first injection site and the inflection point of the neck.   Will return for repeat injection in 3 months.   A 200 units of Botox was used, 155 units were injected, the rest of the Botox was wasted. The  patient tolerated the procedure well, there were no complications of the above procedure.  Butch Penny, MSN, NP-C 02/17/2023, 8:23 AM [facility] [address] [place], [place] [address] [phone]

## 2023-04-21 ENCOUNTER — Telehealth: Payer: Self-pay | Admitting: Adult Health

## 2023-04-21 NOTE — Telephone Encounter (Signed)
Completed [employer] PA renewal form and placed in NP office for signature.

## 2023-05-04 NOTE — Telephone Encounter (Signed)
Received signed PA form, faxed with OV notes to [place] at [phone].

## 2023-05-05 NOTE — Telephone Encounter (Signed)
Received fax of approval from [place].  Berkley Harvey: [id number] (05/07/23-05/05/24)

## 2023-05-12 NOTE — Progress Notes (Unsigned)
05/13/23: stable. 3-4 weeks ago headaches increased.  Typically uses a clip that she puts on her hand when she has a headache.  If this does not work she will use Nurtec.  02/17/23: Continues to do well with Botox. Rarely has to use her rescue medication.   11/19/22: Headaches is much better. Maybe 1-2 mild headaches a week if that.   08/27/22:Reports that botox was working well 2 weeks but then headaches returned. She states she cut out all caffeine and now headaches are better.  06/01/22: Restarting botox today  BOTOX PROCEDURE NOTE FOR MIGRAINE HEADACHE    Contraindications and precautions discussed with patient(above). Aseptic procedure was observed and patient tolerated procedure. Procedure performed by Butch Penny, NP  The condition has existed for more than 6 months, and pt does not have a diagnosis of ALS, Myasthenia Gravis or Lambert-Eaton Syndrome.  Risks and benefits of injections discussed and pt agrees to proceed with the procedure.  Written consent obtained  These injections do not cause sedations or hallucinations which the oral therapies may cause.  Indication/Diagnosis: chronic migraine BOTOX(J0585) injection was performed according to protocol by [facility]er[facility]. 200 units of BOTOX was dissolved into 4 cc NS.   NDC: [address]-40  Botox- 100 units x 2 vials Lot: [id number] Expiration: 07/2025 NDC: 9811-9147-82   Bacteriostatic 0.9% Sodium Chloride- 2 mL  Lot: [id number] Expiration: 11/09/2023 NDC: 0865-7846-96   Dx: E95.284    Description of procedure:  The patient was placed in a sitting position. The standard protocol was used for Botox as follows, with 5 units of Botox injected at each site:   -Procerus muscle, midline injection  -Corrugator muscle, bilateral injection  -Frontalis muscle, bilateral injection, with 2 sites each side, medial injection was performed in the upper one third of the frontalis muscle, in the region vertical from the medial inferior  edge of the superior orbital rim. The lateral injection was again in the upper one third of the forehead vertically above the lateral limbus of the cornea, 1.5 cm lateral to the medial injection site.  -Temporalis muscle injection, 4 sites, bilaterally. The first injection was 3 cm above the tragus of the ear, second injection site was 1.5 cm to 3 cm up from the first injection site in line with the tragus of the ear. The third injection site was 1.5-3 cm forward between the first 2 injection sites. The fourth injection site was 1.5 cm posterior to the second injection site.  -Occipitalis muscle injection, 3 sites, bilaterally. The first injection was done one half way between the occipital protuberance and the tip of the mastoid process behind the ear. The second injection site was done lateral and superior to the first, 1 fingerbreadth from the first injection. The third injection site was 1 fingerbreadth superiorly and medially from the first injection site.  -Cervical paraspinal muscle injection, 2 sites, bilateral knee first injection site was 1 cm from the midline of the cervical spine, 3 cm inferior to the lower border of the occipital protuberance. The second injection site was 1.5 cm superiorly and laterally to the first injection site.  -Trapezius muscle injection was performed at 3 sites, bilaterally. The first injection site was in the upper trapezius muscle halfway between the inflection point of the neck, and the acromion. The second injection site was one half way between the acromion and the first injection site. The third injection was done between the first injection site and the inflection point of the neck.   Will return  for repeat injection in 3 months.   A 200 units of Botox was used, 155 units were injected, the rest of the Botox was wasted. The patient tolerated the procedure well, there were no complications of the above procedure.  Butch Penny, MSN, NP-C 05/12/2023, 5:06  PM [facility] [address], [address] [place], [place] [address] [phone]

## 2023-05-13 ENCOUNTER — Ambulatory Visit: Payer: 59 | Admitting: Adult Health

## 2023-05-13 ENCOUNTER — Ambulatory Visit: Payer: 59 | Admitting: Obstetrics and Gynecology

## 2023-05-13 ENCOUNTER — Encounter: Payer: Self-pay | Admitting: Adult Health

## 2023-05-13 DIAGNOSIS — G43719 Chronic migraine without aura, intractable, without status migrainosus: Secondary | ICD-10-CM | POA: Diagnosis not present

## 2023-05-13 MED ORDER — ONABOTULINUMTOXINA 100 UNITS IJ SOLR
155.0000 [IU] | Freq: Once | INTRAMUSCULAR | Status: AC
  Administered 2023-05-13: 155 [IU] via INTRAMUSCULAR

## 2023-05-13 NOTE — Progress Notes (Signed)
Botox- 100 units x 2 vials Lot: [id number] Expiration: 07/2025 NDC: 1610-9604-54  Bacteriostatic 0.9% Sodium Chloride- 2 mL  Lot: [id number] Expiration: 11/09/2023 NDC: 1478-2956-21  Dx: H08.657 B/B Witnessed by Alveria Apley. CMA

## 2023-06-04 ENCOUNTER — Ambulatory Visit (INDEPENDENT_AMBULATORY_CARE_PROVIDER_SITE_OTHER): Payer: 59 | Admitting: Obstetrics and Gynecology

## 2023-06-04 ENCOUNTER — Other Ambulatory Visit (HOSPITAL_COMMUNITY)
Admission: RE | Admit: 2023-06-04 | Discharge: 2023-06-04 | Disposition: A | Discharge status: Home / Self Care | Payer: 59 | Source: Ambulatory Visit | Attending: Obstetrics and Gynecology | Admitting: Obstetrics and Gynecology

## 2023-06-04 ENCOUNTER — Telehealth: Payer: 59 | Admitting: Adult Health

## 2023-06-04 ENCOUNTER — Encounter: Payer: Self-pay | Admitting: Obstetrics and Gynecology

## 2023-06-04 VITALS — BP 116/70 | HR 61 | Ht 65.0 in | Wt 116.0 lb

## 2023-06-04 DIAGNOSIS — T8332XA Displacement of intrauterine contraceptive device, initial encounter: Secondary | ICD-10-CM | POA: Insufficient documentation

## 2023-06-04 DIAGNOSIS — Z01419 Encounter for gynecological examination (general) (routine) without abnormal findings: Secondary | ICD-10-CM | POA: Diagnosis not present

## 2023-06-04 DIAGNOSIS — Z124 Encounter for screening for malignant neoplasm of cervix: Secondary | ICD-10-CM | POA: Insufficient documentation

## 2023-06-04 DIAGNOSIS — G43719 Chronic migraine without aura, intractable, without status migrainosus: Secondary | ICD-10-CM

## 2023-06-04 DIAGNOSIS — T8332XD Displacement of intrauterine contraceptive device, subsequent encounter: Secondary | ICD-10-CM | POA: Diagnosis not present

## 2023-06-04 DIAGNOSIS — G43009 Migraine without aura, not intractable, without status migrainosus: Secondary | ICD-10-CM | POA: Diagnosis not present

## 2023-06-04 DIAGNOSIS — N904 Leukoplakia of vulva: Secondary | ICD-10-CM

## 2023-06-04 DIAGNOSIS — Z975 Presence of (intrauterine) contraceptive device: Secondary | ICD-10-CM

## 2023-06-04 MED ORDER — CLOBETASOL PROPIONATE 0.05 % EX OINT: TOPICAL_OINTMENT | CUTANEOUS | 1 refills | Status: DC

## 2023-06-04 NOTE — Assessment & Plan Note (Signed)
Placed 2021 TVUS confirmed placement in 2022

## 2023-06-04 NOTE — Patient Instructions (Signed)

## 2023-06-04 NOTE — Progress Notes (Signed)
37 y.o. G0P0000 female with Mirena IUD with lost IUD strings (placed 12/15/2019) here for annual exam.   No LMP recorded. (Menstrual status: IUD).    Abnormal bleeding: none Pelvic discharge or pain: none Breast mass, nipple discharge or skin changes : none Birth control: IUD Last PAP:     Component Value Date/Time   DIAGPAP  08/09/2018 0000    NEGATIVE FOR INTRAEPITHELIAL LESIONS OR MALIGNANCY.   ADEQPAP  08/09/2018 0000    Satisfactory for evaluation  endocervical/transformation zone component ABSENT.   Gardasil: completed per patient Sexually active: yes  Exercising: yes  GYN HISTORY: No significant history  OB History  Gravida Para Term Preterm AB Living  0 0 0 0 0 0  SAB IAB Ectopic Multiple Live Births  0 0 0 0 0    Past Medical History:  Diagnosis Date   Broken finger    left pinky   IBS (irritable bowel syndrome)    Migraine without aura    Neuroma    Rosacea    Thyroid disease     Past Surgical History:  Procedure Laterality Date   COLONOSCOPY     FINGER FRACTURE SURGERY Right    5th finger   WISDOM TOOTH EXTRACTION      Current Outpatient Medications on File Prior to Visit  Medication Sig Dispense Refill   Botulinum Toxin Type A (BOTOX) 200 units SOLR Provider to inject 155 units into the muscles of the head and neck every 12 weeks. Discard remainder. 1 each 1   fexofenadine (ALLEGRA) 180 MG tablet Take 180 mg by mouth daily.     fluticasone (FLONASE) 50 MCG/ACT nasal spray Place into both nostrils daily.     levonorgestrel (MIRENA) 20 MCG/24HR IUD 1 each by Intrauterine route once.     levothyroxine (SYNTHROID) 100 MCG tablet Take 100 mcg by mouth daily before breakfast.     meclizine (ANTIVERT) 25 MG tablet Take 25 mg by mouth every 6 (six) hours as needed.     Multiple Vitamins-Minerals (MULTIVITAMIN WITH MINERALS) tablet Take 1 tablet by mouth daily.       NURTEC 75 MG TBDP TAKE 1 TABLET AT THE ONSET OF MIGRAINE. ONLY 1 TAB IN 24 HOURS 8  tablet 11   ondansetron (ZOFRAN) 8 MG tablet Take 1 tablet (8 mg total) by mouth every 8 (eight) hours as needed for nausea or vomiting. 30 tablet 11   Polyethylene Glycol 3350 (MIRALAX PO) Take by mouth.     No current facility-administered medications on file prior to visit.    Social History   Socioeconomic History   Marital status: Married    Spouse name: Not on file   Number of children: 0   Years of education: Not on file   Highest education level: Bachelor's degree (e.g., BA, AB, BS)  Occupational History   Not on file  Tobacco Use   Smoking status: Never   Smokeless tobacco: Never  Vaping Use   Vaping status: Never Used  Substance and Sexual Activity   Alcohol use: No   Drug use: No   Sexual activity: Yes    Partners: Male    Birth control/protection: I.U.D.  Other Topics Concern   Not on file  Social History Narrative   Lives at home with her husband   Recently moved into a new home   Right handed    Caffeine: 2 cups of coffee daily    Social Determinants of Health   Financial Resource Strain:  Not on file  Food Insecurity: Not on file  Transportation Needs: Not on file  Physical Activity: Not on file  Stress: Not on file  Social Connections: Unknown (12/09/2021)   Received from [facility], [facility]   Social Network    Social Network: Not on file  Intimate Partner Violence: Unknown (11/14/2021)   Received from [facility]   HITS    Physically Hurt: Not on file    Insult or Talk Down To: Not on file    Threaten Physical Harm: Not on file    Scream or Curse: Not on file    Family History  Problem Relation Age of Onset   Cervical cancer Mother    Hypertension Father    Thyroid disease Father        hypothyroid   Thyroid disease Sister        hypothyroid   Thyroid disease Maternal Aunt        hypothyroid   Migraines Paternal Aunt    Skin cancer Maternal Grandmother    Colon cancer Paternal Grandfather     Allergies   Allergen Reactions   Ceclor [Cefaclor] Hives      PE Today's Vitals   06/04/23 0829  BP: 116/70  Pulse: 61  SpO2: 100%  Weight: 116 lb (52.6 kg)  Height: 5\' 5"  (1.651 m)   Body mass index is 19.3 kg/m.  Physical Exam Vitals reviewed. Exam conducted with a chaperone present.  Constitutional:      General: She is not in acute distress.    Appearance: Normal appearance.  HENT:     Head: Normocephalic and atraumatic.     Nose: Nose normal.  Eyes:     Extraocular Movements: Extraocular movements intact.     Conjunctiva/sclera: Conjunctivae normal.  Neck:     Thyroid: No thyroid mass, thyromegaly or thyroid tenderness.  Pulmonary:     Effort: Pulmonary effort is normal.  Chest:     Chest wall: No mass or tenderness.  Breasts:    Right: Normal. No swelling, mass, nipple discharge, skin change or tenderness.     Left: Normal. No swelling, mass, nipple discharge, skin change or tenderness.  Abdominal:     General: There is no distension.     Palpations: Abdomen is soft.     Tenderness: There is no abdominal tenderness.  Genitourinary:    General: Normal vulva.     Exam position: Lithotomy position.     Urethra: No prolapse.     Vagina: Normal. No vaginal discharge or bleeding.     Cervix: Normal. No lesion.     Uterus: Normal. Not enlarged and not tender.      Adnexa: Right adnexa normal and left adnexa normal.     Comments: Mild hypopigmentation of upper vulva IUD stings not visualized Musculoskeletal:        General: Normal range of motion.     Cervical back: Normal range of motion.  Lymphadenopathy:     Upper Body:     Right upper body: No axillary adenopathy.     Left upper body: No axillary adenopathy.     Lower Body: No right inguinal adenopathy. No left inguinal adenopathy.  Skin:    General: Skin is warm and dry.  Neurological:     General: No focal deficit present.     Mental Status: She is alert.  Psychiatric:        Mood and Affect: Mood normal.  Behavior: Behavior normal.       Assessment and Plan:        Cervical cancer screening -     Cytology - PAP  Well woman exam with routine gynecological exam Assessment & Plan: Cervical cancer screening performed according to ASCCP guidelines. Labs and immunizations with her primary Encouraged safe sexual practices as indicated Encouraged healthy lifestyle practices with diet and exercise For patients under 50yo, I recommend 1000mg  calcium daily and 600IU of vitamin D daily.    Lichen sclerosus of female genitalia -     Clobetasol Propionate; Apply pea size amount to vulva 2 x a week. With a flare, increase to 2 x a day for up to 2 weeks as needed. Not for daily Lemler term use.  Dispense: 60 g; Refill: 1  Intrauterine contraceptive device threads lost, subsequent encounter Assessment & Plan: Placed 2021 TVUS confirmed placement in 2022   Uses hormone releasing intrauterine device (IUD) for contraception    Rosalyn Gess, MD

## 2023-06-04 NOTE — Patient Instructions (Addendum)
Continue Botox injections every 12 weeks Continue Nurtec for abortive therapy Okay to reintroduce a small amount of caffeine however if it worsens her migraines discontinue

## 2023-06-04 NOTE — Assessment & Plan Note (Signed)
 Cervical cancer screening performed according to ASCCP guidelines. Labs and immunizations with her primary Encouraged safe sexual practices as indicated Encouraged healthy lifestyle practices with diet and exercise For patients under 35yo, I recommend 1000mg  calcium daily and 600IU of vitamin D daily.

## 2023-06-04 NOTE — Progress Notes (Signed)
PATIENT: Catherine Clay DOB: 03-Nov-1985  REASON FOR VISIT: follow up HISTORY FROM: patient  Virtual Visit via Video Note  I connected with Catherine Clay on 06/04/23 at 10:45 AM EDT by a video enabled telemedicine application located remotely at [facility] and verified that I am speaking with the correct person using two identifiers who was located at their own home.   I discussed the limitations of evaluation and management by telemedicine and the availability of in person appointments. The patient expressed understanding and agreed to proceed.   PATIENT: Catherine Clay DOB: 09/10/1985  REASON FOR VISIT: follow up HISTORY FROM: patient  HISTORY OF PRESENT ILLNESS: Today 06/04/23:  Catherine Clay is a 37 y.o. female with a history of migraine headaches. Returns today for follow-up.  She continues to get Botox injections.  This is working well for her.  She states that she may have 1 headache a week but they do not last that Zuckerman.  She typically can use a clip on her hand for pressure-point and her headache will resolve if not she will use Nurtec.  She has eliminated all caffeine but is thinking about reintroducing a small amount.  I think this is reasonable.  Joins me today for virtual visit   REVIEW OF SYSTEMS: Out of a complete 14 system review of symptoms, the patient complains only of the following symptoms, and all other reviewed systems are negative.  ALLERGIES: Allergies  Allergen Reactions   Ceclor [Cefaclor] Hives    HOME MEDICATIONS: Outpatient Medications Prior to Visit  Medication Sig Dispense Refill   Botulinum Toxin Type A (BOTOX) 200 units SOLR Provider to inject 155 units into the muscles of the head and neck every 12 weeks. Discard remainder. 1 each 1   clobetasol ointment (TEMOVATE) 0.05 % Apply pea size amount to vulva 2 x a week. With a flare, increase to 2 x a day for up to 2 weeks as needed. Not for daily Tetrick term use. 60 g 1   fexofenadine  (ALLEGRA) 180 MG tablet Take 180 mg by mouth daily.     fluticasone (FLONASE) 50 MCG/ACT nasal spray Place into both nostrils daily.     levonorgestrel (MIRENA) 20 MCG/24HR IUD 1 each by Intrauterine route once.     levothyroxine (SYNTHROID) 100 MCG tablet Take 100 mcg by mouth daily before breakfast.     meclizine (ANTIVERT) 25 MG tablet Take 25 mg by mouth every 6 (six) hours as needed.     Multiple Vitamins-Minerals (MULTIVITAMIN WITH MINERALS) tablet Take 1 tablet by mouth daily.       NURTEC 75 MG TBDP TAKE 1 TABLET AT THE ONSET OF MIGRAINE. ONLY 1 TAB IN 24 HOURS 8 tablet 11   ondansetron (ZOFRAN) 8 MG tablet Take 1 tablet (8 mg total) by mouth every 8 (eight) hours as needed for nausea or vomiting. 30 tablet 11   Polyethylene Glycol 3350 (MIRALAX PO) Take by mouth.     No facility-administered medications prior to visit.    PAST MEDICAL HISTORY: Past Medical History:  Diagnosis Date   Broken finger    left pinky   IBS (irritable bowel syndrome)    Migraine without aura    Neuroma    Rosacea    Thyroid disease     PAST SURGICAL HISTORY: Past Surgical History:  Procedure Laterality Date   COLONOSCOPY     FINGER FRACTURE SURGERY Right    5th finger   WISDOM  TOOTH EXTRACTION      FAMILY HISTORY: Family History  Problem Relation Age of Onset   Cervical cancer Mother    Hypertension Father    Thyroid disease Father        hypothyroid   Thyroid disease Sister        hypothyroid   Thyroid disease Maternal Aunt        hypothyroid   Migraines Paternal Aunt    Skin cancer Maternal Grandmother    Colon cancer Paternal Grandfather     SOCIAL HISTORY: Social History   Socioeconomic History   Marital status: Married    Spouse name: Not on file   Number of children: 0   Years of education: Not on file   Highest education level: Bachelor's degree (e.g., BA, AB, BS)  Occupational History   Not on file  Tobacco Use   Smoking status: Never   Smokeless tobacco: Never   Vaping Use   Vaping status: Never Used  Substance and Sexual Activity   Alcohol use: No   Drug use: No   Sexual activity: Yes    Partners: Male    Birth control/protection: I.U.D.  Other Topics Concern   Not on file  Social History Narrative   Lives at home with her husband   Recently moved into a new home   Right handed    Caffeine: 2 cups of coffee daily    Social Determinants of Health   Financial Resource Strain: Not on file  Food Insecurity: Not on file  Transportation Needs: Not on file  Physical Activity: Not on file  Stress: Not on file  Social Connections: Unknown (12/09/2021)   Received [facility]   Social Network    Social Network: Not on file  Intimate Partner Violence: Unknown (11/14/2021)   Received from [facility], [facility]   HITS    Physically Hurt: Not on file    Insult or Talk Down To: Not on file    Threaten Physical Harm: Not on file    Scream or Curse: Not on file      PHYSICAL EXAM Generalized: Well developed, in no acute distress   Neurological examination  Mentation: Alert oriented to time, place, history taking. Follows all commands speech and language fluent Cranial nerve II-XII:Extraocular movements were full. Facial symmetry noted.   DIAGNOSTIC DATA (LABS, IMAGING, TESTING) - I reviewed patient records, labs, notes, testing and imaging myself where available.  Lab Results  Component Value Date   WBC 5.9 12/15/2018   HGB 13.8 12/15/2018   HCT 40.3 12/15/2018   MCV 92 12/15/2018   PLT 286 12/15/2018      Component Value Date/Time   NA 137 12/15/2018 1546   K 4.3 12/15/2018 1546   CL 102 12/15/2018 1546   CO2 22 12/15/2018 1546   GLUCOSE 82 12/15/2018 1546   GLUCOSE 108 (H) 05/02/2017 1956   BUN 7 12/15/2018 1546   CREATININE 0.79 12/15/2018 1546   CALCIUM 9.5 12/15/2018 1546   PROT 7.4 12/15/2018 1546   ALBUMIN 4.2 12/15/2018 1546   AST 20 12/15/2018 1546   ALT 16 12/15/2018 1546   ALKPHOS 68  12/15/2018 1546   BILITOT 0.3 12/15/2018 1546   GFRNONAA 101 12/15/2018 1546   GFRAA 116 12/15/2018 1546   Lab Results  Component Value Date   TSH 3.289 02/26/2011      ASSESSMENT AND PLAN 37 y.o. year old female  has a past medical history of Broken finger,  IBS (irritable bowel syndrome), Migraine without aura, Neuroma, Rosacea, and Thyroid disease. here with:  Migraine headaches   -Continue Botox injections every 12 weeks -Continue Nurtec for abortive therapy -Okay to reintroduce a small amount of caffeine however if migraines worsen I would discontinue -Follow-up at her next Botox appointment    Butch Penny, MSN, NP-C 06/04/2023, 10:32 AM [facility] [address] [place], [place] [address] [phone]

## 2023-06-07 ENCOUNTER — Encounter: Payer: Self-pay | Admitting: Adult Health

## 2023-06-08 LAB — CYTOLOGY - PAP
Comment: NEGATIVE
Diagnosis: NEGATIVE
High risk HPV: NEGATIVE

## 2023-06-14 MED ORDER — METHYLPREDNISOLONE 4 MG PO TBPK
ORAL_TABLET | ORAL | 0 refills | Status: DC
Start: 2023-06-14 — End: 2023-08-18

## 2023-06-14 NOTE — Telephone Encounter (Signed)
Noted  

## 2023-06-21 NOTE — Telephone Encounter (Signed)
Can you ask her if we have given her anything in the past that has helped? She may need to consider going to urgent care so they can evaluate her and maybe do a cocktail. I don't have any openings this week as of now.

## 2023-06-24 ENCOUNTER — Ambulatory Visit: Payer: 59 | Admitting: *Deleted

## 2023-06-24 VITALS — BP 110/75 | HR 57

## 2023-06-24 DIAGNOSIS — G43719 Chronic migraine without aura, intractable, without status migrainosus: Secondary | ICD-10-CM | POA: Diagnosis not present

## 2023-06-24 MED ORDER — KETOROLAC TROMETHAMINE 60 MG/2ML IM SOLN
30.0000 mg | Freq: Once | INTRAMUSCULAR | Status: AC
  Administered 2023-06-24: 30 mg via INTRAMUSCULAR

## 2023-06-24 NOTE — Progress Notes (Signed)
Per Verbal Order Megan NP, Administered Toradol 30 mg in left deltoid. Pt tolerated well. Reports daily migraine which comes and goes for 2 weeks. She is on Botox and Nurtec.

## 2023-06-28 MED ORDER — TOPIRAMATE 50 MG PO TABS: 50.0000 mg | ORAL_TABLET | Freq: Every day | ORAL | 3 refills | Status: DC

## 2023-06-28 NOTE — Telephone Encounter (Signed)
Yes ok to start. Just advise that if she removes IUD to try to conceive she will need to stop topamax

## 2023-06-28 NOTE — Telephone Encounter (Addendum)
Spoke with pt. She denies further migraines since last Thursday.  She previously tried nortriptyline but did not tolerate it (many side effects).  Topamax worked the best years ago. She denies new symptoms but states these recent migraines are closer to the severity of migraines she had years ago before realizing what they were. She gets motion sick.  She feels the recent migraines might be caused more by her increase in looking at a computer screen.  She feels blue light glasses make things worse.  She has not looked into getting an additional dimmer on her screen yet.  The screen gets more blurry than it used to.  I suggested she see an eye doctor.  She states her posture is not great but she does not experience any neck pain. She isn't sure but doesn't feel her migraines are related to her cycle. She has an IUD and doesn't have periods often. Pt is aware if her migraine returns to seek urgent care for tx. We will be back in touch with suggestion from [place]. She's willing to retry Topamax in addition to Botox.

## 2023-06-28 NOTE — Addendum Note (Signed)
Addended by: Bertram Savin on: 06/28/2023 04:13 PM   Modules accepted: Orders

## 2023-08-15 ENCOUNTER — Telehealth: Payer: Self-pay | Admitting: Adult Health

## 2023-08-15 NOTE — Telephone Encounter (Signed)
 Pt was called to be informed that due to inclement weather, their appt has been cx and they will be called back tomorrow to r/s. Pt verbalized understanding.

## 2023-08-16 ENCOUNTER — Ambulatory Visit: Payer: 59 | Admitting: Adult Health

## 2023-08-16 NOTE — Telephone Encounter (Signed)
 Pt coming on 1/8.

## 2023-08-18 ENCOUNTER — Ambulatory Visit: Payer: 59 | Admitting: Adult Health

## 2023-08-18 DIAGNOSIS — G43719 Chronic migraine without aura, intractable, without status migrainosus: Secondary | ICD-10-CM | POA: Diagnosis not present

## 2023-08-18 MED ORDER — ONABOTULINUMTOXINA 200 UNITS IJ SOLR
155.0000 [IU] | Freq: Once | INTRAMUSCULAR | Status: AC
  Administered 2023-08-18: 155 [IU] via INTRAMUSCULAR

## 2023-08-18 NOTE — Progress Notes (Signed)
 08/18/23: restarted Topamax  50 mg at bedtime. That has helped. Hardly having any headaches now. Uses nurtec when she gets a headache. No new medical history since last seen.   05/13/23: stable. 3-4 weeks ago headaches increased.  Typically uses a clip that she puts on her hand when she has a headache.  If this does not work she will use Nurtec.  02/17/23: Continues to do well with Botox . Rarely has to use her rescue medication.   11/19/22: Headaches is much better. Maybe 1-2 mild headaches a week if that.   08/27/22:Reports that botox  was working well 2 weeks but then headaches returned. She states she cut out all caffeine and now headaches are better.  06/01/22: Restarting botox  today  BOTOX  PROCEDURE NOTE FOR MIGRAINE HEADACHE    Contraindications and precautions discussed with patient(above). Aseptic procedure was observed and patient tolerated procedure. Procedure performed by Duwaine Russell, NP  The condition has existed for more than 6 months, and pt does not have a diagnosis of ALS, Myasthenia Gravis or Lambert-Eaton Syndrome.  Risks and benefits of injections discussed and pt agrees to proceed with the procedure.  Written consent obtained  These injections do not cause sedations or hallucinations which the oral therapies may cause.  Indication/Diagnosis: chronic migraine BOTOX (G9414) injection was performed according to protocol by [employer]. 200 units of BOTOX  was dissolved into 4 cc NS.   NDC: [address]-01  Botox - 200 units x 1 vial Lot: [id number] Expiration: 10/2025 NDC: 9976-6078-97   Bacteriostatic 0.9% Sodium Chloride - 4 mL  Lot: [id number] Expiration: 06/10/2024 NDC: 9590-8033-97   Dx: H56.280     Description of procedure:  The patient was placed in a sitting position. The standard protocol was used for Botox  as follows, with 5 units of Botox  injected at each site:   -Procerus muscle, midline injection  -Corrugator muscle, bilateral injection  -Frontalis muscle,  bilateral injection, with 2 sites each side, medial injection was performed in the upper one third of the frontalis muscle, in the region vertical from the medial inferior edge of the superior orbital rim. The lateral injection was again in the upper one third of the forehead vertically above the lateral limbus of the cornea, 1.5 cm lateral to the medial injection site.  -Temporalis muscle injection, 4 sites, bilaterally. The first injection was 3 cm above the tragus of the ear, second injection site was 1.5 cm to 3 cm up from the first injection site in line with the tragus of the ear. The third injection site was 1.5-3 cm forward between the first 2 injection sites. The fourth injection site was 1.5 cm posterior to the second injection site.  -Occipitalis muscle injection, 3 sites, bilaterally. The first injection was done one half way between the occipital protuberance and the tip of the mastoid process behind the ear. The second injection site was done lateral and superior to the first, 1 fingerbreadth from the first injection. The third injection site was 1 fingerbreadth superiorly and medially from the first injection site.  -Cervical paraspinal muscle injection, 2 sites, bilateral knee first injection site was 1 cm from the midline of the cervical spine, 3 cm inferior to the lower border of the occipital protuberance. The second injection site was 1.5 cm superiorly and laterally to the first injection site.  -Trapezius muscle injection was performed at 3 sites, bilaterally. The first injection site was in the upper trapezius muscle halfway between the inflection point of the neck, and the acromion. The second injection site was  one half way between the acromion and the first injection site. The third injection was done between the first injection site and the inflection point of the neck.   Will return for repeat injection in 3 months.   A 200 units of Botox  was used, 155 units were injected,  the rest of the Botox  was wasted. The patient tolerated the procedure well, there were no complications of the above procedure.  Duwaine Russell, MSN, NP-C 08/18/2023, 9:07 AM [facility] [address], [address] [place], [place] [address] [phone]

## 2023-08-18 NOTE — Progress Notes (Signed)
 Botox- 200 units x 1 vial Lot: [id number] Expiration: 10/2025 NDC: 6962-9528-41  Bacteriostatic 0.9% Sodium Chloride- 4 mL  Lot: [id number] Expiration: 06/10/2024 NDC: 0272-5366-44  Dx: I34.742 B/B Witnessed by Truitt Leep RN

## 2023-09-01 ENCOUNTER — Telehealth: Payer: Self-pay | Admitting: Adult Health

## 2023-09-01 NOTE — Telephone Encounter (Signed)
LVM and sent mychart msg informing pt of appt change on 11/18/23 - NP schedule change

## 2023-10-06 ENCOUNTER — Encounter: Payer: Self-pay | Admitting: Adult Health

## 2023-10-08 MED ORDER — TOPIRAMATE ER 50 MG PO CAP24: 50.0000 mg | ORAL_CAPSULE | Freq: Every day | ORAL | 5 refills | Status: DC

## 2023-10-22 ENCOUNTER — Other Ambulatory Visit: Payer: Self-pay | Admitting: Adult Health

## 2023-11-18 ENCOUNTER — Ambulatory Visit: Payer: 59 | Admitting: Adult Health

## 2023-11-18 VITALS — BP 124/63 | HR 59

## 2023-11-18 DIAGNOSIS — G43719 Chronic migraine without aura, intractable, without status migrainosus: Secondary | ICD-10-CM

## 2023-11-18 MED ORDER — ONABOTULINUMTOXINA 200 UNITS IJ SOLR
155.0000 [IU] | Freq: Once | INTRAMUSCULAR | Status: AC
Start: 2023-11-18 — End: 2023-11-18
  Administered 2023-11-18: 155 [IU] via INTRAMUSCULAR

## 2023-11-18 NOTE — Progress Notes (Signed)
 11/18/23: Here for botox. Reports tolerating botox well last first. trokendi is better in regards to side effects. Reports 6 headaches a month. No aura. Denies any associated symptoms such as weakness/numbness/tiningly. Feels headache are better with trokendi.   - did advised she would need to come off trokendi if she wants to conceive.   08/18/23: restarted Topamax 50 mg at bedtime. That has helped. Hardly having any headaches now. Uses nurtec when she gets a headache. No new medical history since last seen.   05/13/23: stable. 3-4 weeks ago headaches increased.  Typically uses a clip that she puts on her hand when she has a headache.  If this does not work she will use Nurtec.  02/17/23: Continues to do well with Botox. Rarely has to use her rescue medication.   11/19/22: Headaches is much better. Maybe 1-2 mild headaches a week if that.   08/27/22:Reports that botox was working well 2 weeks but then headaches returned. She states she cut out all caffeine and now headaches are better.  06/01/22: Restarting botox today  BOTOX PROCEDURE NOTE FOR MIGRAINE HEADACHE    Contraindications and precautions discussed with patient(above). Aseptic procedure was observed and patient tolerated procedure. Procedure performed by Butch Penny, NP  The condition has existed for more than 6 months, and pt does not have a diagnosis of ALS, Myasthenia Gravis or Lambert-Eaton Syndrome.  Risks and benefits of injections discussed and pt agrees to proceed with the procedure.  Written consent obtained  These injections do not cause sedations or hallucinations which the oral therapies may cause.  Indication/Diagnosis: chronic migraine BOTOX(J0585) injection was performed according to protocol by [employer]. 200 units of BOTOX was dissolved into 4 cc NS.   NDC: [address]-78  Botox- 200 units x 1 vial Lot: [id number] Expiration: 11/2025 NDC: 0865-7846-96   Bacteriostatic 0.9% Sodium Chloride- * mL  Lot:  [id number] Expiration: 06/10/2024 NDC: 1324-4010-27   Dx: O53.664    Description of procedure:  The patient was placed in a sitting position. The standard protocol was used for Botox as follows, with 5 units of Botox injected at each site:   -Procerus muscle, midline injection  -Corrugator muscle, bilateral injection  -Frontalis muscle, bilateral injection, with 2 sites each side, medial injection was performed in the upper one third of the frontalis muscle, in the region vertical from the medial inferior edge of the superior orbital rim. The lateral injection was again in the upper one third of the forehead vertically above the lateral limbus of the cornea, 1.5 cm lateral to the medial injection site.  -Temporalis muscle injection, 4 sites, bilaterally. The first injection was 3 cm above the tragus of the ear, second injection site was 1.5 cm to 3 cm up from the first injection site in line with the tragus of the ear. The third injection site was 1.5-3 cm forward between the first 2 injection sites. The fourth injection site was 1.5 cm posterior to the second injection site.  -Occipitalis muscle injection, 3 sites, bilaterally. The first injection was done one half way between the occipital protuberance and the tip of the mastoid process behind the ear. The second injection site was done lateral and superior to the first, 1 fingerbreadth from the first injection. The third injection site was 1 fingerbreadth superiorly and medially from the first injection site.  -Cervical paraspinal muscle injection, 2 sites, bilateral knee first injection site was 1 cm from the midline of the cervical spine, 3 cm inferior to the lower  border of the occipital protuberance. The second injection site was 1.5 cm superiorly and laterally to the first injection site.  -Trapezius muscle injection was performed at 3 sites, bilaterally. The first injection site was in the upper trapezius muscle halfway between the  inflection point of the neck, and the acromion. The second injection site was one half way between the acromion and the first injection site. The third injection was done between the first injection site and the inflection point of the neck.   Will return for repeat injection in 3 months.   A 200 units of Botox was used, 155 units were injected, the rest of the Botox was wasted. The patient tolerated the procedure well, there were no complications of the above procedure.  Butch Penny, MSN, NP-C 11/18/2023, 9:39 AM [facility] [address], [address] [place], [place] [address] [phone]

## 2023-11-18 NOTE — Progress Notes (Signed)
 Botox- 200 units x 1 vial Lot: [id number] Expiration: 11/2025 NDC: 4782-9562-13   Bacteriostatic 0.9% Sodium Chloride- * mL  Lot: [id number] Expiration: 06/10/2024 NDC: 4696-2952-84   Dx: X32.440 BB Witnessed by: Clemencia Course

## 2023-12-21 ENCOUNTER — Other Ambulatory Visit: Payer: Self-pay | Admitting: Adult Health

## 2023-12-23 ENCOUNTER — Encounter: Payer: Self-pay | Admitting: Adult Health

## 2023-12-23 ENCOUNTER — Other Ambulatory Visit: Payer: Self-pay

## 2023-12-23 MED ORDER — NURTEC 75 MG PO TBDP: ORAL_TABLET | ORAL | 2 refills | Status: DC

## 2023-12-31 ENCOUNTER — Encounter: Payer: Self-pay | Admitting: Adult Health

## 2024-01-06 ENCOUNTER — Encounter: Payer: Self-pay | Admitting: Adult Health

## 2024-01-06 NOTE — Telephone Encounter (Signed)
 LVM and sent mychart msg asking pt to call back to schedule OV or VV with [facility]

## 2024-01-09 NOTE — Progress Notes (Unsigned)
 PATIENT: Catherine Clay DOB: 04/27/1986  REASON FOR VISIT: follow up HISTORY FROM: patient  Virtual Visit via Video Note  I connected with Catherine Clay on 01/11/24 at  2:00 PM EDT by a video enabled telemedicine application located remotely at [facility] and verified that I am speaking with the correct person using two identifiers who was located at their own work in [place].   I discussed the limitations of evaluation and management by telemedicine and the availability of in person appointments. The patient expressed understanding and agreed to proceed.   PATIENT: Catherine Clay DOB: 05/19/1986  REASON FOR VISIT: follow up HISTORY FROM: patient  HISTORY OF PRESENT ILLNESS: Today 01/11/24  Catherine Clay is a 38 y.o. female with a history of migraine headaches. Returns today for follow-up.  She continues on Botox  injections she states that she is having approximately 2 migraines a week.  She does not always have to use Nurtec for her migraines.  She states that she has noticed with Trokendi  that it causes brain fog.  She would like to consider another medication other than Trokendi .  Patient has tried Qulipta , Trileptal, Ajovy , nortriptyline .  06/04/23: Catherine Clay is a 38 y.o. female with a history of migraine headaches. Returns today for follow-up.  She continues to get Botox  injections.  This is working well for her.  She states that she may have 1 headache a week but they do not last that [place].  She typically can use a clip on her hand for pressure-point and her headache will resolve if not she will use Nurtec.  She has eliminated all caffeine but is thinking about reintroducing a small amount.  I think this is reasonable.  Joins me today for virtual visit   REVIEW OF SYSTEMS: Out of a complete 14 system review of symptoms, the patient complains only of the following symptoms, and all other reviewed systems are negative.  ALLERGIES: Allergies  Allergen Reactions    Ceclor [Cefaclor] Hives    HOME MEDICATIONS: Outpatient Medications Prior to Visit  Medication Sig Dispense Refill   Botulinum Toxin Type A  (BOTOX ) 200 units SOLR Provider to inject 155 units into the muscles of the head and neck every 12 weeks. Discard remainder. 1 each 1   clobetasol  ointment (TEMOVATE ) 0.05 % Apply pea size amount to vulva 2 x a week. With a flare, increase to 2 x a day for up to 2 weeks as needed. Not for daily Korf term use. 60 g 1   fexofenadine (ALLEGRA) 180 MG tablet Take 180 mg by mouth daily.     fluticasone (FLONASE) 50 MCG/ACT nasal spray Place into both nostrils daily.     levonorgestrel  (MIRENA ) 20 MCG/24HR IUD 1 each by Intrauterine route once.     levothyroxine (SYNTHROID) 100 MCG tablet Take 100 mcg by mouth daily before breakfast.     Multiple Vitamins-Minerals (MULTIVITAMIN WITH MINERALS) tablet Take 1 tablet by mouth daily.       Polyethylene Glycol 3350 (MIRALAX PO) Take by mouth.     Rimegepant Sulfate (NURTEC) 75 MG TBDP Take 1 tablet at the onset of migraine. Only 1 tab in 24 hours 8 tablet 2   Topiramate  ER (TROKENDI  XR) 50 MG CP24 Take 1 capsule (50 mg total) by mouth at bedtime. 30 capsule 5   No facility-administered medications prior to visit.    PAST MEDICAL HISTORY: Past Medical History:  Diagnosis Date   Broken finger  left pinky   IBS (irritable bowel syndrome)    Migraine without aura    Neuroma    Rosacea    Thyroid  disease     PAST SURGICAL HISTORY: Past Surgical History:  Procedure Laterality Date   COLONOSCOPY     FINGER FRACTURE SURGERY Right    5th finger   WISDOM TOOTH EXTRACTION      FAMILY HISTORY: Family History  Problem Relation Age of Onset   Cervical cancer Mother    Hypertension Father    Thyroid  disease Father        hypothyroid   Thyroid  disease Sister        hypothyroid   Thyroid  disease Maternal Aunt        hypothyroid   Migraines Paternal Aunt    Skin cancer Maternal Grandmother    Colon  cancer Paternal Grandfather     SOCIAL HISTORY: Social History   Socioeconomic History   Marital status: Married    Spouse name: Not on file   Number of children: 0   Years of education: Not on file   Highest education level: Bachelor's degree (e.g., BA, AB, BS)  Occupational History   Not on file  Tobacco Use   Smoking status: Never   Smokeless tobacco: Never  Vaping Use   Vaping status: Never Used  Substance and Sexual Activity   Alcohol use: No   Drug use: No   Sexual activity: Yes    Partners: Male    Birth control/protection: I.U.D.  Other Topics Concern   Not on file  Social History Narrative   Lives at home with her husband   Recently moved into a new home   Right handed    Caffeine: 2 cups of coffee daily    Social Drivers of Corporate investment banker Strain: Not on file  Food Insecurity: Not on file  Transportation Needs: Not on file  Physical Activity: Not on file  Stress: Not on file  Social Connections: Unknown (12/09/2021)   Received from [facility]   Social Network    Social Network: Not on file  Intimate Partner Violence: Unknown (11/14/2021)   Received from [facility]   HITS    Physically Hurt: Not on file    Insult or Talk Down To: Not on file    Threaten Physical Harm: Not on file    Scream or Curse: Not on file      PHYSICAL EXAM Generalized: Well developed, in no acute distress   Neurological examination  Mentation: Alert oriented to time, place, history taking. Follows all commands speech and language fluent Cranial nerve II-XII:Extraocular movements were full. Facial symmetry noted.   DIAGNOSTIC DATA (LABS, IMAGING, TESTING) - I reviewed patient records, labs, notes, testing and imaging myself where available.  Lab Results  Component Value Date   WBC 5.9 12/15/2018   HGB 13.8 12/15/2018   HCT 40.3 12/15/2018   MCV 92 12/15/2018   PLT 286 12/15/2018      Component Value Date/Time   NA 137  12/15/2018 1546   K 4.3 12/15/2018 1546   CL 102 12/15/2018 1546   CO2 22 12/15/2018 1546   GLUCOSE 82 12/15/2018 1546   GLUCOSE 108 (H) 05/02/2017 1956   BUN 7 12/15/2018 1546   CREATININE 0.79 12/15/2018 1546   CALCIUM 9.5 12/15/2018 1546   PROT 7.4 12/15/2018 1546   ALBUMIN 4.2 12/15/2018 1546   AST 20 12/15/2018 1546   ALT 16 12/15/2018  1546   ALKPHOS 68 12/15/2018 1546   BILITOT 0.3 12/15/2018 1546   GFRNONAA 101 12/15/2018 1546   GFRAA 116 12/15/2018 1546   Lab Results  Component Value Date   TSH 3.289 02/26/2011      ASSESSMENT AND PLAN 38 y.o. year old female  has a past medical history of Broken finger, IBS (irritable bowel syndrome), Migraine without aura, Neuroma, Rosacea, and Thyroid  disease. here with:  Migraine headaches   -Continue Botox  injections every 12 weeks -Continue Nurtec for abortive therapy -Advised that she could consider switching to [employer] or Zonegran.  Reviewed both medications with the patient and potential side effects.  Provided information on her after visit summary.  She will let us  know which medication she wants to try.  Pending on what medication she wants to try we will stop Trokendi  -Follow-up at her next Botox  appointment    Clem Currier, MSN, NP-C 01/13/2024, 8:06 AM [facility] [address], [address] [place], [place] [address] [phone]

## 2024-01-11 ENCOUNTER — Telehealth: Admitting: Adult Health

## 2024-01-11 DIAGNOSIS — G43719 Chronic migraine without aura, intractable, without status migrainosus: Secondary | ICD-10-CM

## 2024-01-11 DIAGNOSIS — G43009 Migraine without aura, not intractable, without status migrainosus: Secondary | ICD-10-CM

## 2024-01-11 NOTE — Patient Instructions (Signed)
 Your Plan:  Continue botox  injections Consider switching to zonegran or emgality. Ready info attached If your symptoms worsen or you develop new symptoms please let us  know.       Thank you for coming to see us  at [facility]. I hope we have been able to provide you high quality care today.  You may receive a patient satisfaction survey over the next few weeks. We would appreciate your feedback and comments so that we may continue to improve ourselves and the health of our patients.

## 2024-01-12 ENCOUNTER — Encounter: Payer: Self-pay | Admitting: Adult Health

## 2024-01-25 ENCOUNTER — Telehealth: Admitting: Adult Health

## 2024-01-31 ENCOUNTER — Telehealth: Payer: Self-pay | Admitting: Adult Health

## 2024-01-31 NOTE — Telephone Encounter (Signed)
 Request to r/s appointment

## 2024-02-16 ENCOUNTER — Ambulatory Visit: Admitting: Adult Health

## 2024-02-17 ENCOUNTER — Encounter: Payer: Self-pay | Admitting: Adult Health

## 2024-02-22 NOTE — Telephone Encounter (Signed)
 Will work on serbia after [employer] is active, plan starts on 7/18.

## 2024-02-24 ENCOUNTER — Telehealth: Payer: Self-pay | Admitting: Adult Health

## 2024-02-24 DIAGNOSIS — G43719 Chronic migraine without aura, intractable, without status migrainosus: Secondary | ICD-10-CM

## 2024-02-24 NOTE — Telephone Encounter (Signed)
 Pt has new [employer] insurance. I completed PA form for her new PBM, [employer], and faxed with notes to [phone]. No auth required for [id number] through medical benefit.

## 2024-02-28 ENCOUNTER — Telehealth: Payer: Self-pay | Admitting: Pharmacist

## 2024-02-28 NOTE — Telephone Encounter (Signed)
 Pharmacy Patient Advocate Encounter   Received notification from CoverMyMeds that prior authorization for Nurtec 75MG  dispersible tablets is required/requested.   Insurance verification completed.   The patient is insured through [facility] .   Per test claim: PA required; PA submitted to above mentioned insurance via CoverMyMeds Key/confirmation #/EOC [id number] Status is pending

## 2024-02-29 ENCOUNTER — Encounter: Payer: Self-pay | Admitting: Adult Health

## 2024-03-01 NOTE — Telephone Encounter (Signed)
 Called [employer] to check the status of Catherine Clay, spoke with Nat. She states they didn't receive the auth but was able to build a new one through [facility] and I submitted it, status is pending. Key: [id number]

## 2024-03-08 ENCOUNTER — Other Ambulatory Visit (HOSPITAL_COMMUNITY): Payer: Self-pay

## 2024-03-08 ENCOUNTER — Telehealth: Payer: Self-pay | Admitting: *Deleted

## 2024-03-08 ENCOUNTER — Telehealth: Payer: Self-pay | Admitting: Pharmacy Technician

## 2024-03-08 NOTE — Telephone Encounter (Signed)
 Pt has new insurance. Can we do PA for Topiramate  ER?

## 2024-03-08 NOTE — Telephone Encounter (Signed)
 Noted thanks

## 2024-03-08 NOTE — Telephone Encounter (Signed)
 PA request has been PA Not Needed. New Encounter has been or will be created for follow up. For additional info see Pharmacy Prior Auth telephone encounter from 03/08/2024.

## 2024-03-08 NOTE — Telephone Encounter (Signed)
 Pharmacy Patient Advocate Encounter   Received notification from Pt Calls Messages that prior authorization for Topiramate  ER 50 mg Cp24 is required/requested.   Insurance verification completed.   The patient is insured through [facility] [facility] .   Per test claim: The current 30 day co-pay is, $10.00.  No PA needed at this time. This test claim was processed through [facility]- copay amounts may vary at other pharmacies due to pharmacy/plan contracts, or as the patient moves through the different stages of their insurance plan.

## 2024-03-08 NOTE — Telephone Encounter (Signed)
 Any updates on this?

## 2024-03-09 ENCOUNTER — Other Ambulatory Visit (HOSPITAL_COMMUNITY): Payer: Self-pay

## 2024-03-09 ENCOUNTER — Telehealth: Payer: Self-pay

## 2024-03-09 NOTE — Telephone Encounter (Signed)
 Received faxed form from insurance-faxed completed form along with clinicals to [phone].  Pharmacy Patient Advocate Encounter   Received notification from Physician's Office that prior authorization for Nurtec is required/requested.   Insurance verification completed.   The patient is insured through [facility] .   Per test claim: PA required; PA submitted to above mentioned insurance via Fax Key/confirmation #/EOC N/A Status is pending

## 2024-03-09 NOTE — Telephone Encounter (Signed)
 Thanks, I updated the patient.

## 2024-03-09 NOTE — Telephone Encounter (Signed)
 CMM rejected the claim stating already a claim in process of review-I called [employer] back for the second time-they state they def do not have a pa from cmm for this pt-they are going to fax me the form.

## 2024-03-09 NOTE — Telephone Encounter (Signed)
 I called [employer] and they could not find the PA submitted via CMM-They have sent me a new CMM key and I will resubmit. They would not allow e to submit via telephone.

## 2024-03-09 NOTE — Telephone Encounter (Addendum)
 Not yet-[employer] can take up to 15 days to give a determination. I will try to reach out to them today to follow up.

## 2024-03-09 NOTE — Telephone Encounter (Signed)
 PA request has been Started. New Encounter has been or will be created for follow up. For additional info see Pharmacy Prior Auth telephone encounter from 03/09/2024.

## 2024-03-09 NOTE — Telephone Encounter (Signed)
 Clifford thanks

## 2024-03-13 ENCOUNTER — Other Ambulatory Visit (HOSPITAL_COMMUNITY): Payer: Self-pay

## 2024-03-13 NOTE — Telephone Encounter (Signed)
 Pharmacy Patient Advocate Encounter  Received notification from [facility] that Prior Authorization for Nurtec has been APPROVED from 03/13/2024 to 08/09/2221. Ran test claim, Copay is $0. This test claim was processed through [facility]- copay amounts may vary at other pharmacies due to pharmacy/plan contracts, or as the patient moves through the different stages of their insurance plan.   PA #/Case ID/Reference #: [id number]

## 2024-03-15 ENCOUNTER — Other Ambulatory Visit: Payer: Self-pay

## 2024-03-15 ENCOUNTER — Other Ambulatory Visit (HOSPITAL_COMMUNITY): Payer: Self-pay

## 2024-03-15 ENCOUNTER — Telehealth: Payer: Self-pay

## 2024-03-15 MED ORDER — BOTOX 200 UNITS IJ SOLR
INTRAMUSCULAR | 3 refills | Status: DC
  Filled 2024-03-15: qty 1, 84d supply, fill #0

## 2024-03-15 MED ORDER — BOTOX 200 UNITS IJ SOLR: INTRAMUSCULAR | 3 refills | Status: AC

## 2024-03-15 NOTE — Telephone Encounter (Signed)
 Monica with [facility] reached out and said she got a rejected claim stating pt needs to fill through [facility]. Please resend rx, thank you!  [employer] [address] [place], [place] [address] Phone: [phone] Fax: [phone]

## 2024-03-15 NOTE — Progress Notes (Signed)
 Rejection:  Must fill thru [facility] (PH#:[phone]).  Transfer or have physician send new Rx. Notify patient to call to enroll. PA may be required for this medication.

## 2024-03-15 NOTE — Progress Notes (Signed)
 Patient to be enrolled with [facility]. Routed to Rx Prior Auth Team (ATTN: Monica).

## 2024-03-15 NOTE — Telephone Encounter (Signed)
 Botox  Rx sent to

## 2024-03-15 NOTE — Addendum Note (Signed)
 Addended by: HILLIARD HEATHER CROME on: 03/15/2024 11:03 AM   Modules accepted: Orders

## 2024-03-15 NOTE — Telephone Encounter (Signed)
 Auth#: [id number] (03/02/24-03/02/25)

## 2024-03-15 NOTE — Telephone Encounter (Signed)
Botox 200 unit Rx sent to [facility].

## 2024-03-15 NOTE — Addendum Note (Signed)
 Addended by: HILLIARD HEATHER CROME on: 03/15/2024 03:18 PM   Modules accepted: Orders

## 2024-03-15 NOTE — Telephone Encounter (Signed)
 I was asked by [employer] to run test claim to see if PT could fill with our pharmacy-however per test claim PT must fill with [facility]. Jillian with [employer] has been made aware.

## 2024-03-21 ENCOUNTER — Encounter: Payer: Self-pay | Admitting: Adult Health

## 2024-03-27 MED ORDER — ZONISAMIDE 25 MG PO CAPS: 50.0000 mg | ORAL_CAPSULE | Freq: Every day | ORAL | 11 refills | Status: DC

## 2024-04-27 ENCOUNTER — Ambulatory Visit: Admitting: Adult Health

## 2024-06-09 ENCOUNTER — Ambulatory Visit (INDEPENDENT_AMBULATORY_CARE_PROVIDER_SITE_OTHER): Admitting: Radiology

## 2024-06-09 ENCOUNTER — Ambulatory Visit: Payer: 59 | Admitting: Obstetrics and Gynecology

## 2024-06-09 ENCOUNTER — Encounter: Payer: Self-pay | Admitting: Radiology

## 2024-06-09 VITALS — BP 106/64 | HR 60 | Ht 64.57 in | Wt 115.0 lb

## 2024-06-09 DIAGNOSIS — Z1331 Encounter for screening for depression: Secondary | ICD-10-CM

## 2024-06-09 DIAGNOSIS — T8332XD Displacement of intrauterine contraceptive device, subsequent encounter: Secondary | ICD-10-CM | POA: Diagnosis not present

## 2024-06-09 DIAGNOSIS — N904 Leukoplakia of vulva: Secondary | ICD-10-CM | POA: Diagnosis not present

## 2024-06-09 DIAGNOSIS — Z01419 Encounter for gynecological examination (general) (routine) without abnormal findings: Secondary | ICD-10-CM | POA: Diagnosis not present

## 2024-06-09 MED ORDER — ESTRADIOL 0.01 % VA CREA: TOPICAL_CREAM | VAGINAL | 6 refills | Status: AC

## 2024-06-09 MED ORDER — CLOBETASOL PROPIONATE 0.05 % EX OINT: TOPICAL_OINTMENT | CUTANEOUS | 1 refills | Status: AC

## 2024-06-09 NOTE — Progress Notes (Signed)
 Catherine Clay 1985-11-12 [id number]   History:  38 y.o. G0 presents for annual exam. Known 'lost' IUD strings, placement confirmed with u/s. Due to be removed 2029. Lichen sclerosus managed with clobetasol . No itching, area is a little sore and hypopigmented.  Gynecologic History No LMP recorded. (Menstrual status: IUD).   Contraception/Family planning: IUD Sexually active: yes Last Pap: 2024. Results were: normal   Obstetric History OB History  Gravida Para Term Preterm AB Living  0 0 0 0 0 0  SAB IAB Ectopic Multiple Live Births  0 0 0 0 0       06/09/2024   11:25 AM  Depression screen PHQ 2/9  Decreased Interest 0  Down, Depressed, Hopeless 0  PHQ - 2 Score 0     The following portions of the patient's history were reviewed and updated as appropriate: allergies, current medications, past family history, past medical history, past social history, past surgical history, and problem list.  Review of Systems  All other systems reviewed and are negative.   Past medical history, past surgical history, family history and social history were all reviewed and documented in the EPIC chart.  Exam:  Vitals:   06/09/24 1128  BP: 106/64  Pulse: 60  SpO2: 100%  Weight: 115 lb (52.2 kg)  Height: 5' 4.57 (1.64 m)   Body mass index is 19.39 kg/m.  Physical Exam Vitals and nursing note reviewed. Exam conducted with a chaperone present.  Constitutional:      Appearance: Normal appearance. She is normal weight.  HENT:     Head: Normocephalic and atraumatic.  Neck:     Thyroid : No thyroid  mass, thyromegaly or thyroid  tenderness.  Cardiovascular:     Rate and Rhythm: Regular rhythm.     Heart sounds: Normal heart sounds.  Pulmonary:     Effort: Pulmonary effort is normal.     Breath sounds: Normal breath sounds.  Chest:  Breasts:    Breasts are symmetrical.     Right: Normal. No inverted nipple, mass, nipple discharge, skin change or tenderness.     Left: Normal. No  inverted nipple, mass, nipple discharge, skin change or tenderness.  Abdominal:     General: Abdomen is flat. Bowel sounds are normal.     Palpations: Abdomen is soft.  Genitourinary:    Vagina: Normal. No vaginal discharge, bleeding or lesions.     Cervix: Normal. No discharge or lesion.     Uterus: Normal. Not enlarged and not tender.      Adnexa: Right adnexa normal and left adnexa normal.       Right: No mass, tenderness or fullness.         Left: No mass, tenderness or fullness.       Comments: Hypopigmentation and loss of vulvar architecture c/w lichen sclerosus  Lymphadenopathy:     Upper Body:     Right upper body: No axillary adenopathy.     Left upper body: No axillary adenopathy.  Skin:    General: Skin is warm and dry.  Neurological:     Mental Status: She is alert and oriented to person, place, and time.  Psychiatric:        Mood and Affect: Mood normal.        Thought Content: Thought content normal.        Judgment: Judgment normal.      Catherine Clay, CMA present for exam  Assessment/Plan:   1. Well woman exam with routine gynecological exam (Primary)  Pap 2027 Mammo at 42 Labs with PCP  2. Lichen sclerosus of female genitalia Will add estrogen cream to protect vulva from thinning and further loss of architecture - clobetasol  ointment (TEMOVATE ) 0.05 %; Apply pea size amount to vulva 2 x a week. With a flare, increase to 2 x a day for up to 2 weeks as needed. Not for daily [employer] term use.  Dispense: 60 g; Refill: 1 - estradiol  (ESTRACE ) 0.01 % CREA vaginal cream; Apply 1gram to the vulva 3 times a week  Dispense: 42.5 g; Refill: 6  3. Intrauterine contraceptive device threads lost, subsequent encounter Due for removal in 2029  4. Depression screen     Return in about 1 year (around 06/09/2025) for Annual.  GINETTE COZIER B WHNP-BC 11:50 AM 06/09/2024

## 2024-06-26 ENCOUNTER — Other Ambulatory Visit: Payer: Self-pay | Admitting: Adult Health

## 2024-06-28 ENCOUNTER — Encounter: Payer: Self-pay | Admitting: Adult Health

## 2024-07-10 ENCOUNTER — Encounter: Payer: Self-pay | Admitting: Adult Health

## 2024-07-12 NOTE — Telephone Encounter (Signed)
 Pt has called and scheduled a my chart vv with Duwaine, NP on 12/4 she will be in [place] for appointment and is aware RN will call if there are questions  Pt understands that although there may be some limitations with this type of visit, we will take all precautions to reduce any security or privacy concerns.  Pt understands that this will be treated like an in office visit and we will file with pt's insurance, and there may be a patient responsible charge related to this service.

## 2024-07-13 ENCOUNTER — Telehealth: Admitting: Adult Health

## 2024-07-13 DIAGNOSIS — G43009 Migraine without aura, not intractable, without status migrainosus: Secondary | ICD-10-CM

## 2024-07-13 DIAGNOSIS — G43719 Chronic migraine without aura, intractable, without status migrainosus: Secondary | ICD-10-CM

## 2024-07-13 MED ORDER — ZONISAMIDE 25 MG PO CAPS: 75.0000 mg | ORAL_CAPSULE | Freq: Every day | ORAL | 11 refills | Status: AC

## 2024-07-13 NOTE — Patient Instructions (Signed)
 Your Plan:  Increase Zonegran  to 75 mg at bedtime If this is not beneficial we will resume Botox  injections     Thank you for coming to see us  at [facility]. I hope we have been able to provide you high quality care today.  You may receive a patient satisfaction survey over the next few weeks. We would appreciate your feedback and comments so that we may continue to improve ourselves and the health of our patients.

## 2024-07-13 NOTE — Progress Notes (Signed)
 PATIENT: Catherine Clay DOB: [birth year]  REASON FOR VISIT: follow up HISTORY FROM: patient  Virtual Visit via Video Note  I connected with Catherine Clay on 01/11/24 at  2:30 PM EST by a video enabled telemedicine application located remotely at [facility] and verified that I am speaking with the correct person using two identifiers who was located at their own work in [place].   I discussed the limitations of evaluation and management by telemedicine and the availability of in person appointments. The patient expressed understanding and agreed to proceed.   PATIENT: Catherine Clay DOB: 10/12/1985  REASON FOR VISIT: follow up HISTORY FROM: patient  HISTORY OF PRESENT ILLNESS: Today 01/11/24:  Catherine Clay is a 38 y.o. female with a history of Migraines headaches. Returns today for follow-up.  She reports that her headaches have increased in frequency having about 4-5 headaches a week.  Severity is also increased Nurtec continues to give her good benefit.  She states her Botox  was approved but because her headaches were under good control she held off on scheduling the appointment.  She remains on Zonegran  50 mg at bedtime tolerating it well  01/11/24: Catherine Clay is a 38 y.o. female with a history of migraine headaches. Returns today for follow-up.  She continues on Botox  injections she states that she is having approximately 2 migraines a week.  She does not always have to use Nurtec for her migraines.  She states that she has noticed with Trokendi  that it causes brain fog.  She would like to consider another medication other than Trokendi .  Patient has tried Qulipta , Trileptal, Ajovy , nortriptyline .  06/04/23: Catherine Clay is a 38 y.o. female with a history of migraine headaches. Returns today for follow-up.  She continues to get Botox  injections.  This is working well for her.  She states that she may have 1 headache a week but they do not last that Rosencrans.  She typically can use  a clip on her hand for pressure-point and her headache will resolve if not she will use Nurtec.  She has eliminated all caffeine but is thinking about reintroducing a small amount.  I think this is reasonable.  Joins me today for virtual visit   REVIEW OF SYSTEMS: Out of a complete 14 system review of symptoms, the patient complains only of the following symptoms, and all other reviewed systems are negative.  ALLERGIES: Allergies  Allergen Reactions   Ceclor [Cefaclor] Hives    HOME MEDICATIONS: Outpatient Medications Prior to Visit  Medication Sig Dispense Refill   botulinum toxin Type A  (BOTOX ) 200 units injection Provider to inject 155 units into the muscles of the head and neck every 12 weeks. Discard remainder. (Patient not taking: Reported on 06/09/2024) 1 each 3   clobetasol  ointment (TEMOVATE ) 0.05 % Apply pea size amount to vulva 2 x a week. With a flare, increase to 2 x a day for up to 2 weeks as needed. Not for daily Masini term use. 60 g 1   estradiol  (ESTRACE ) 0.01 % CREA vaginal cream Apply 1gram to the vulva 3 times a week 42.5 g 6   fexofenadine (ALLEGRA) 180 MG tablet Take 180 mg by mouth daily.     fluticasone (FLONASE) 50 MCG/ACT nasal spray Place into both nostrils daily.     levonorgestrel  (MIRENA ) 20 MCG/24HR IUD 1 each by Intrauterine route once.     levothyroxine (SYNTHROID) 125 MCG tablet Take 125 mcg by  mouth daily.     Multiple Vitamins-Minerals (MULTIVITAMIN WITH MINERALS) tablet Take 1 tablet by mouth daily.       Polyethylene Glycol 3350 (MIRALAX PO) Take by mouth.     Rimegepant Sulfate (NURTEC) 75 MG TBDP TAKE 1 TABLET AT THE ONSET OF MIGRAINE. ONLY 1 TAB IN 24 HOURS 8 tablet 5   zonisamide  (ZONEGRAN ) 25 MG capsule Take 2 capsules (50 mg total) by mouth at bedtime. 60 capsule 11   No facility-administered medications prior to visit.    PAST MEDICAL HISTORY: Past Medical History:  Diagnosis Date   Broken finger    left pinky   IBS (irritable bowel  syndrome)    Migraine without aura    Neuroma    Rosacea    Thyroid  disease     PAST SURGICAL HISTORY: Past Surgical History:  Procedure Laterality Date   COLONOSCOPY     FINGER FRACTURE SURGERY Right    5th finger   WISDOM TOOTH EXTRACTION      FAMILY HISTORY: Family History  Problem Relation Age of Onset   Cervical cancer Mother    Hypertension Father    Thyroid  disease Father        hypothyroid   Thyroid  disease Sister        hypothyroid   Thyroid  disease Maternal Aunt        hypothyroid   Migraines Paternal Aunt    Skin cancer Maternal Grandmother    Colon cancer Paternal Grandfather     SOCIAL HISTORY: Social History   Socioeconomic History   Marital status: Married    Spouse name: Not on file   Number of children: 0   Years of education: Not on file   Highest education level: Bachelor's degree (e.g., BA, AB, BS)  Occupational History   Not on file  Tobacco Use   Smoking status: Never   Smokeless tobacco: Never  Vaping Use   Vaping status: Never Used  Substance and Sexual Activity   Alcohol use: No   Drug use: No   Sexual activity: Yes    Partners: Male    Birth control/protection: I.U.D.  Other Topics Concern   Not on file  Social History Narrative   Lives at home with her husband   Recently moved into a new home   Right handed    Caffeine: 2 cups of coffee daily    Social Drivers of Corporate Investment Banker Strain: Not on file  Food Insecurity: Not on file  Transportation Needs: Not on file  Physical Activity: Not on file  Stress: Not on file  Social Connections: Unknown (12/09/2021)   Received from [facility]   Social Network    Social Network: Not on file  Intimate Partner Violence: Unknown (11/14/2021)   Received from [facility]   HITS    Physically Hurt: Not on file    Insult or Talk Down To: Not on file    Threaten Physical Harm: Not on file    Scream or Curse: Not on file      PHYSICAL EXAM Generalized: Well  developed, in no acute distress   Neurological examination  Mentation: Alert oriented to time, place, history taking. Follows all commands speech and language fluent Cranial nerve II-XII:Extraocular movements were full. Facial symmetry noted.   DIAGNOSTIC DATA (LABS, IMAGING, TESTING) - I reviewed patient records, labs, notes, testing and imaging myself where available.  Lab Results  Component Value Date   WBC 5.9 12/15/2018   HGB 13.8  12/15/2018   HCT 40.3 12/15/2018   MCV 92 12/15/2018   PLT 286 12/15/2018      Component Value Date/Time   NA 137 12/15/2018 1546   K 4.3 12/15/2018 1546   CL 102 12/15/2018 1546   CO2 22 12/15/2018 1546   GLUCOSE 82 12/15/2018 1546   GLUCOSE 108 (H) 05/02/2017 1956   BUN 7 12/15/2018 1546   CREATININE 0.79 12/15/2018 1546   CALCIUM 9.5 12/15/2018 1546   PROT 7.4 12/15/2018 1546   ALBUMIN 4.2 12/15/2018 1546   AST 20 12/15/2018 1546   ALT 16 12/15/2018 1546   ALKPHOS 68 12/15/2018 1546   BILITOT 0.3 12/15/2018 1546   GFRNONAA 101 12/15/2018 1546   GFRAA 116 12/15/2018 1546   Lab Results  Component Value Date   TSH 3.289 02/26/2011      ASSESSMENT AND PLAN 38 y.o. year old female  has a past medical history of Broken finger, IBS (irritable bowel syndrome), Migraine without aura, Neuroma, Rosacea, and Thyroid  disease. here with:  Migraine headaches   - We discussed increasing Zonegran  or resuming Botox .  She opted to increase Zonegran  to 75 mg at bedtime.  If this is not beneficial we will resume Botox  injections -Continue Nurtec for abortive therapy - Patient will let me know in 1 month how she is doing.  If she is doing well we can schedule office visit in 6 to 8 months.  Meds ordered this encounter  Medications   zonisamide  (ZONEGRAN ) 25 MG capsule    Sig: Take 3 capsules (75 mg total) by mouth at bedtime.    Dispense:  90 capsule    Refill:  11    Supervising Provider:   YAN, YIJUN [[id number]]    Duwaine Russell, MSN, NP-C  07/13/2024, 2:44 PM [facility] [address], [address] [place], [place] [address] [phone]  The patient's condition requires frequent monitoring and adjustments in the treatment plan, reflecting the ongoing complexity of care.  This provider is the continuing focal point for all needed services for this condition.

## 2024-08-29 ENCOUNTER — Encounter: Payer: Self-pay | Admitting: Adult Health

## 2024-08-30 ENCOUNTER — Telehealth: Payer: Self-pay | Admitting: Adult Health

## 2024-08-30 NOTE — Telephone Encounter (Signed)
 Patient request refill for zonisamide  (ZONEGRAN ) 25 MG capsule send to  [facility] [phone]   Patient also asking to please read MyChart message sent

## 2024-08-31 NOTE — Telephone Encounter (Signed)
 Spoke to pharmacy insurance will approve tomorrow for refill of medication . Per phamrmist if patient is out will give her enough to have todays dose . Pt aware

## 2024-08-31 NOTE — Telephone Encounter (Signed)
 Patient called to check on refill for  zonisamide  (ZONEGRAN ) 25 MG capsule

## 2024-08-31 NOTE — Telephone Encounter (Signed)
 Spoke to patient aware that per patient insurance Zonisamide  refill will be available for pick up 09/01/2024

## 2025-06-15 ENCOUNTER — Ambulatory Visit: Admitting: Radiology
